# Patient Record
Sex: Female | Born: 1985 | Race: White | Hispanic: No | Marital: Married | Smoking: Former smoker
Health system: Southern US, Community
[De-identification: ages and names within clinical notes are randomized; demographics above are authoritative.]

## PROBLEM LIST (undated history)

## (undated) ENCOUNTER — Inpatient Hospital Stay (HOSPITAL_COMMUNITY): Payer: Self-pay

## (undated) DIAGNOSIS — Z8619 Personal history of other infectious and parasitic diseases: Secondary | ICD-10-CM

## (undated) DIAGNOSIS — F419 Anxiety disorder, unspecified: Secondary | ICD-10-CM

## (undated) DIAGNOSIS — R51 Headache: Secondary | ICD-10-CM

## (undated) DIAGNOSIS — R519 Headache, unspecified: Secondary | ICD-10-CM

## (undated) DIAGNOSIS — Z202 Contact with and (suspected) exposure to infections with a predominantly sexual mode of transmission: Secondary | ICD-10-CM

## (undated) DIAGNOSIS — J45909 Unspecified asthma, uncomplicated: Secondary | ICD-10-CM

## (undated) HISTORY — DX: Personal history of other infectious and parasitic diseases: Z86.19

## (undated) HISTORY — DX: Contact with and (suspected) exposure to infections with a predominantly sexual mode of transmission: Z20.2

## (undated) HISTORY — PX: CHOLECYSTECTOMY: SHX55

---

## 2012-04-11 ENCOUNTER — Encounter: Payer: Self-pay | Admitting: Obstetrics and Gynecology

## 2012-06-26 LAB — OB RESULTS CONSOLE GC/CHLAMYDIA: Chlamydia: NEGATIVE

## 2012-06-26 LAB — OB RESULTS CONSOLE HEPATITIS B SURFACE ANTIGEN: Hepatitis B Surface Ag: NEGATIVE

## 2012-06-26 LAB — OB RESULTS CONSOLE ANTIBODY SCREEN: Antibody Screen: NEGATIVE

## 2012-08-30 ENCOUNTER — Encounter (HOSPITAL_COMMUNITY): Payer: Self-pay | Admitting: *Deleted

## 2012-08-30 ENCOUNTER — Emergency Department (HOSPITAL_COMMUNITY)
Admission: EM | Admit: 2012-08-30 | Discharge: 2012-08-30 | Disposition: A | Discharge status: Home / Self Care | Payer: BC Managed Care – PPO | Attending: Emergency Medicine | Admitting: Emergency Medicine

## 2012-08-30 ENCOUNTER — Emergency Department (HOSPITAL_COMMUNITY): Payer: BC Managed Care – PPO

## 2012-08-30 DIAGNOSIS — O9989 Other specified diseases and conditions complicating pregnancy, childbirth and the puerperium: Secondary | ICD-10-CM | POA: Insufficient documentation

## 2012-08-30 DIAGNOSIS — R0602 Shortness of breath: Secondary | ICD-10-CM | POA: Insufficient documentation

## 2012-08-30 DIAGNOSIS — J45901 Unspecified asthma with (acute) exacerbation: Secondary | ICD-10-CM | POA: Insufficient documentation

## 2012-08-30 DIAGNOSIS — O99330 Smoking (tobacco) complicating pregnancy, unspecified trimester: Secondary | ICD-10-CM | POA: Insufficient documentation

## 2012-08-30 HISTORY — DX: Unspecified asthma, uncomplicated: J45.909

## 2012-08-30 MED ORDER — ALBUTEROL SULFATE HFA 108 (90 BASE) MCG/ACT IN AERS: 1.0000 | INHALATION_SPRAY | RESPIRATORY_TRACT | Status: DC

## 2012-08-30 MED ORDER — ALBUTEROL SULFATE (5 MG/ML) 0.5% IN NEBU: 5.0000 mg | INHALATION_SOLUTION | Freq: Once | RESPIRATORY_TRACT | Status: DC

## 2012-08-30 MED ORDER — IPRATROPIUM BROMIDE 0.02 % IN SOLN
0.5000 mg | Freq: Once | RESPIRATORY_TRACT | Status: AC
  Administered 2012-08-30: 0.5 mg via RESPIRATORY_TRACT
  Filled 2012-08-30: qty 2.5

## 2012-08-30 MED ORDER — ALBUTEROL (5 MG/ML) CONTINUOUS INHALATION SOLN
10.0000 mg/h | INHALATION_SOLUTION | RESPIRATORY_TRACT | Status: DC
  Administered 2012-08-30: 10 mg/h via RESPIRATORY_TRACT
  Filled 2012-08-30: qty 20

## 2012-08-30 NOTE — ED Notes (Signed)
Pt reports sob r/t asthma since this am. Was seen at her PCP and given 2 albuterol treatments, reports no relief. Hx asthma, no recent attacks. Pt is [redacted] wks pregnant. No OB complaints.

## 2012-08-30 NOTE — ED Provider Notes (Signed)
History     CSN: [id number]  Arrival date & time 08/30/12  1148   First MD Initiated Contact with Patient 08/30/12 1154      Chief Complaint  Patient presents with  . Asthma  . Shortness of Breath    (Consider location/radiation/quality/duration/timing/severity/associated sxs/prior treatment) HPI The patient presents with dyspnea.  Symptoms began approximately 4 hours prior to ER arrival.  The patient has a history of asthma, though no recent exacerbations, no recent steroid use.,  She is [redacted] weeks pregnant.  She states this episode began after precipitant, and since onset has not been relieved in spite of using 2 albuterol treatments at her physician's office.  She denies chest pain, syncope, abdominal pain, vomiting, diarrhea. She states that this pregnancy is proceeding without consultation, she has seen her obstetrician several times.  Past Medical History  Diagnosis Date  . Asthma     Past Surgical History  Procedure Laterality Date  . Cholecystectomy      No family history on file.  History  Substance Use Topics  . Smoking status: Current Every Day Smoker  . Smokeless tobacco: Not on file  . Alcohol Use: No    OB History   Grav Para Term Preterm Abortions TAB SAB Ect Mult Living   1               Review of Systems  Constitutional:       Per HPI, otherwise negative  HENT:       Per HPI, otherwise negative  Respiratory:       Per HPI, otherwise negative  Cardiovascular:       Per HPI, otherwise negative  Gastrointestinal: Negative for nausea, vomiting, abdominal pain and diarrhea.  Endocrine:       Negative aside from HPI  Genitourinary:       Neg aside from HPI   Musculoskeletal:       Per HPI, otherwise negative  Skin: Negative.   Neurological: Negative for syncope.    Allergies  Review of patient's allergies indicates no known allergies.  Home Medications  No current outpatient prescriptions on file.  BP 128/60  Pulse 93  Temp(Src) 0 F  (-17.8 C)  Resp 35  SpO2 99%  Physical Exam  Nursing note and vitals reviewed. Constitutional: She is oriented to person, place, and time. She appears well-developed and well-nourished. No distress.  HENT:  Head: Normocephalic and atraumatic.  Eyes: Conjunctivae and EOM are normal.  Cardiovascular: Normal rate and regular rhythm.   Pulmonary/Chest: Accessory muscle usage present. No stridor. Tachypnea noted. No respiratory distress. She has decreased breath sounds. She has wheezes.  Abdominal: She exhibits no distension.  Soft, gravid, abdomen  Musculoskeletal: She exhibits no edema.  Neurological: She is alert and oriented to person, place, and time. No cranial nerve deficit.  Skin: Skin is warm and dry.  Psychiatric: She has a normal mood and affect.    ED Course  [place] bedside Date/Time: 08/30/2012 3:20 PM Performed by: Gerhard Munch Authorized by: Gerhard Munch Risks and benefits: risks, benefits and alternatives were discussed Consent given by: patient Patient understanding: patient states understanding of the procedure being performed Patient consent: the patient's understanding of the procedure matches consent given Procedure consent: procedure consent matches procedure scheduled Relevant documents: relevant documents present and verified Test results: test results available and properly labeled Site marked: the operative site was marked Imaging studies: imaging studies available Required items: required blood products, implants, devices, and special equipment available Patient  identity confirmed: verbally with patient Time out: Immediately prior to procedure a "time out" was called to verify the correct patient, procedure, equipment, support staff and site/side marked as required. Preparation: Patient was prepped and draped in the usual sterile fashion. Local anesthesia used: no Patient sedated: no Patient tolerance: Patient tolerated the procedure well with no  immediate complications. Comments: FHT 120's - w significant motion.   (including critical care time)  Labs Reviewed - No data to display Dg Chest 2 View (if Patient Has Fever And/or Copd)  08/30/2012  *RADIOLOGY REPORT*  Clinical Data: Asthma, shortness of breath  CHEST - 2 VIEW  Comparison: 11/16/11  Findings: Cardiomediastinal silhouette is stable.  No acute infiltrate or pleural effusion.  No pulmonary edema.  Bony thorax is stable.  IMPRESSION: No active disease.   Original Report Authenticated By: Natasha Mead, M.D.      No diagnosis found.  Update: Following initial meds, the patient was improved, though she continued to have bilateral wheezing.  After discussion on the present cons of all medication in pregnancy, she will have a continuous nebulizer treatment, be reassessed.  3:15 PM Patient substantially better.  No wheezing.  MDM  This [redacted] week pregnant female presents with ongoing dyspnea.  Notably, patient's pregnancy has been going well prior to this point.  On initial exam the patient is tachypneic, uncomfortable appearing.  This improved substantially following multiple albuterol treatments.  The patient's wheezing stopped entirely, and there is no refractory wheezing after a period of observation.  Fetal heart tones were good, good fetal motion, and the patient has an access to obstetric followup, including an appointment tomorrow.       Gerhard Munch, MD 08/30/12 [phone]

## 2012-08-30 NOTE — ED Notes (Signed)
Patient transported to X-ray 

## 2012-11-12 ENCOUNTER — Encounter (HOSPITAL_COMMUNITY): Payer: Self-pay | Admitting: *Deleted

## 2012-11-12 ENCOUNTER — Inpatient Hospital Stay (HOSPITAL_COMMUNITY)
Admission: AD | Admit: 2012-11-12 | Discharge: 2012-11-12 | Disposition: A | Discharge status: Home / Self Care | Payer: BC Managed Care – PPO | Source: Ambulatory Visit | Attending: Obstetrics & Gynecology | Admitting: Obstetrics & Gynecology

## 2012-11-12 ENCOUNTER — Inpatient Hospital Stay (HOSPITAL_COMMUNITY): Payer: BC Managed Care – PPO

## 2012-11-12 DIAGNOSIS — O99891 Other specified diseases and conditions complicating pregnancy: Secondary | ICD-10-CM | POA: Insufficient documentation

## 2012-11-12 HISTORY — DX: Anxiety disorder, unspecified: F41.9

## 2012-11-12 LAB — OB RESULTS CONSOLE ABO/RH: RH Type: POSITIVE

## 2012-11-12 NOTE — MAU Provider Note (Signed)
Ms. Shanicqua Coldren is a 27 y.o. G1P0 at [redacted]w[redacted]d who presents to MAU today with complaint of LOF.   BP 121/76  Pulse 92  Temp(Src) 98.5 F (36.9 C) (Oral)  Resp 18  Ht 5\' 6"  (1.676 m)  Wt 226 lb 3.2 oz (102.604 kg)  BMI 36.53 kg/m2 GENERAL: Well-developed, well-nourished female in no acute distress.  HEENT: Normocephalic, atraumatic.   LUNGS: Effort normal HEART: Regular rate  PELVIC: Normal external genitalia.  SKIN: Warm, dry and without erythema PSYCH: Normal mood and affect  Neg - pooling Neg - ferning  Report to RN. She will contact MD for further instruction  Freddi Starr, PA-C 11/12/2012 9:05 PM

## 2012-11-12 NOTE — MAU Note (Signed)
Pt G1 at 38.3wks leaking clear fluid since Saturday.  Seen in the office today, Dr. Arlyce Dice checked fluid.  Pt was told to come to MAU if continued leaking fluid.

## 2012-11-26 ENCOUNTER — Telehealth (HOSPITAL_COMMUNITY): Payer: Self-pay | Admitting: *Deleted

## 2012-11-26 ENCOUNTER — Encounter (HOSPITAL_COMMUNITY): Payer: Self-pay | Admitting: *Deleted

## 2012-11-26 NOTE — Telephone Encounter (Signed)
Preadmission screen  

## 2012-11-27 ENCOUNTER — Telehealth (HOSPITAL_COMMUNITY): Payer: Self-pay | Admitting: *Deleted

## 2012-11-27 ENCOUNTER — Encounter (HOSPITAL_COMMUNITY): Payer: Self-pay | Admitting: *Deleted

## 2012-11-27 LAB — OB RESULTS CONSOLE GBS: GBS: NEGATIVE

## 2012-11-27 NOTE — Telephone Encounter (Signed)
Preadmission screen  

## 2012-11-29 ENCOUNTER — Inpatient Hospital Stay (HOSPITAL_COMMUNITY)
Admission: RE | Admit: 2012-11-29 | Discharge: 2012-12-03 | DRG: 371 | Disposition: A | Discharge status: Home / Self Care | Payer: BC Managed Care – PPO | Source: Ambulatory Visit | Attending: Obstetrics and Gynecology | Admitting: Obstetrics and Gynecology

## 2012-11-29 ENCOUNTER — Encounter (HOSPITAL_COMMUNITY): Payer: Self-pay

## 2012-11-29 VITALS — BP 96/63 | HR 83 | Temp 97.8°F | Resp 20 | Ht 66.0 in | Wt 226.0 lb

## 2012-11-29 DIAGNOSIS — O480 Post-term pregnancy: Principal | ICD-10-CM | POA: Diagnosis present

## 2012-11-29 DIAGNOSIS — O34219 Maternal care for unspecified type scar from previous cesarean delivery: Secondary | ICD-10-CM

## 2012-11-29 LAB — CBC
MCH: 28.2 pg (ref 26.0–34.0)
MCHC: 33.3 g/dL (ref 30.0–36.0)
Platelets: 225 10*3/uL (ref 150–400)
RDW: 14.5 % (ref 11.5–15.5)

## 2012-11-29 LAB — TYPE AND SCREEN: ABO/RH(D): A POS

## 2012-11-29 MED ORDER — BUTORPHANOL TARTRATE 1 MG/ML IJ SOLN: 1.0000 mg | INTRAMUSCULAR | Status: DC | PRN

## 2012-11-29 MED ORDER — CITRIC ACID-SODIUM CITRATE 334-500 MG/5ML PO SOLN
30.0000 mL | ORAL | Status: DC | PRN
  Filled 2012-11-29: qty 15

## 2012-11-29 MED ORDER — LACTATED RINGERS IV SOLN
500.0000 mL | INTRAVENOUS | Status: DC | PRN
  Administered 2012-11-30: 300 mL via INTRAVENOUS
  Administered 2012-11-30: 500 mL via INTRAVENOUS
  Administered 2012-11-30: 300 mL via INTRAVENOUS

## 2012-11-29 MED ORDER — IBUPROFEN 600 MG PO TABS: 600.0000 mg | ORAL_TABLET | Freq: Four times a day (QID) | ORAL | Status: DC | PRN

## 2012-11-29 MED ORDER — OXYCODONE-ACETAMINOPHEN 5-325 MG PO TABS: 1.0000 | ORAL_TABLET | ORAL | Status: DC | PRN

## 2012-11-29 MED ORDER — MISOPROSTOL 25 MCG QUARTER TABLET
25.0000 ug | ORAL_TABLET | ORAL | Status: DC | PRN
  Administered 2012-11-29 – 2012-11-30 (×3): 25 ug via VAGINAL
  Filled 2012-11-29 (×3): qty 0.25

## 2012-11-29 MED ORDER — FLEET ENEMA 7-19 GM/118ML RE ENEM: 1.0000 | ENEMA | RECTAL | Status: DC | PRN

## 2012-11-29 MED ORDER — ACETAMINOPHEN 325 MG PO TABS: 650.0000 mg | ORAL_TABLET | ORAL | Status: DC | PRN

## 2012-11-29 MED ORDER — LACTATED RINGERS IV SOLN
INTRAVENOUS | Status: DC
  Administered 2012-11-30 – 2012-12-01 (×7): via INTRAVENOUS

## 2012-11-29 MED ORDER — ONDANSETRON HCL 4 MG/2ML IJ SOLN
4.0000 mg | Freq: Four times a day (QID) | INTRAMUSCULAR | Status: DC | PRN
  Administered 2012-11-30: 4 mg via INTRAVENOUS
  Filled 2012-11-29: qty 2

## 2012-11-29 MED ORDER — OXYTOCIN 40 UNITS IN LACTATED RINGERS INFUSION - SIMPLE MED: 62.5000 mL/h | INTRAVENOUS | Status: DC

## 2012-11-29 MED ORDER — LIDOCAINE HCL (PF) 1 % IJ SOLN: 30.0000 mL | INTRAMUSCULAR | Status: DC | PRN

## 2012-11-29 MED ORDER — OXYTOCIN BOLUS FROM INFUSION: 500.0000 mL | INTRAVENOUS | Status: DC

## 2012-11-29 MED ORDER — TERBUTALINE SULFATE 1 MG/ML IJ SOLN: 0.2500 mg | Freq: Once | INTRAMUSCULAR | Status: AC | PRN

## 2012-11-29 MED ORDER — ZOLPIDEM TARTRATE 5 MG PO TABS
5.0000 mg | ORAL_TABLET | Freq: Every evening | ORAL | Status: DC | PRN
  Administered 2012-11-29: 5 mg via ORAL
  Filled 2012-11-29: qty 1

## 2012-11-29 NOTE — H&P (Signed)
27 y.o. [redacted]w[redacted]d  G1P0 comes in for postdates induction.  Otherwise has good fetal movement and no bleeding.  Past Medical History  Diagnosis Date  . Asthma     as child  . Anxiety     no meds during pregnancy  . Hx of chlamydia infection   . Trichomonas contact, treated   . Hx of varicella     Past Surgical History  Procedure Laterality Date  . Cholecystectomy      OB History   Grav Para Term Preterm Abortions TAB SAB Ect Mult Living   1              # Outc Date GA Lbr Len/2nd Wgt Sex Del Anes PTL Lv   1 CUR               History   Social History  . Marital Status: Single    Spouse Name: N/A    Number of Children: N/A  . Years of Education: N/A   Occupational History  . Not on file.   Social History Main Topics  . Smoking status: Current Every Day Smoker -- 1.00 packs/day for 10 years    Types: Cigarettes  . Smokeless tobacco: Never Used  . Alcohol Use: No  . Drug Use: No  . Sexually Active: Yes   Other Topics Concern  . Not on file   Social History Narrative  . No narrative on file   Latex    Prenatal Transfer Tool  Maternal Diabetes: No Genetic Screening: Normal Maternal Ultrasounds/Referrals: Normal Fetal Ultrasounds or other Referrals:  None Maternal Substance Abuse:  Yes:  Type: Smoker Significant Maternal Medications:  None Significant Maternal Lab Results: None  Other WUJ:[id number].    Filed Vitals:   11/29/12 1946  BP: 117/73  Pulse: 91  Temp: 98.6 F (37 C)  Resp: 18     Lungs/Cor:  NAD Abdomen:  soft, gravid Ex:  no cords, erythema SVE:  0/70/-1  Per nurse. FHTs:  140s, good STV, NST R Toco:  q10-15   A/P   Postdates induction.   GBS neg.   Macio Kissoon A

## 2012-11-30 ENCOUNTER — Encounter (HOSPITAL_COMMUNITY): Payer: Self-pay | Admitting: Anesthesiology

## 2012-11-30 ENCOUNTER — Inpatient Hospital Stay (HOSPITAL_COMMUNITY): Payer: BC Managed Care – PPO | Admitting: Anesthesiology

## 2012-11-30 ENCOUNTER — Encounter (HOSPITAL_COMMUNITY): Payer: Self-pay

## 2012-11-30 LAB — RPR: RPR Ser Ql: NONREACTIVE

## 2012-11-30 MED ORDER — LACTATED RINGERS IV SOLN
500.0000 mL | Freq: Once | INTRAVENOUS | Status: AC
  Administered 2012-11-30: 500 mL via INTRAVENOUS

## 2012-11-30 MED ORDER — LIDOCAINE HCL (PF) 1 % IJ SOLN
INTRAMUSCULAR | Status: DC | PRN
  Administered 2012-11-30 – 2012-12-01 (×6): 4 mL

## 2012-11-30 MED ORDER — DIPHENHYDRAMINE HCL 50 MG/ML IJ SOLN: 12.5000 mg | INTRAMUSCULAR | Status: DC | PRN

## 2012-11-30 MED ORDER — FENTANYL 2.5 MCG/ML BUPIVACAINE 1/10 % EPIDURAL INFUSION (WH - ANES)
14.0000 mL/h | INTRAMUSCULAR | Status: DC | PRN
  Administered 2012-11-30 – 2012-12-01 (×4): 14 mL/h via EPIDURAL
  Filled 2012-11-30 (×5): qty 125

## 2012-11-30 MED ORDER — EPHEDRINE 5 MG/ML INJ: 10.0000 mg | INTRAVENOUS | Status: DC | PRN

## 2012-11-30 MED ORDER — TERBUTALINE SULFATE 1 MG/ML IJ SOLN: 0.2500 mg | Freq: Once | INTRAMUSCULAR | Status: AC | PRN

## 2012-11-30 MED ORDER — OXYTOCIN 40 UNITS IN LACTATED RINGERS INFUSION - SIMPLE MED
1.0000 m[IU]/min | INTRAVENOUS | Status: DC
  Administered 2012-11-30: 2 m[IU]/min via INTRAVENOUS
  Filled 2012-11-30: qty 1000

## 2012-11-30 MED ORDER — PHENYLEPHRINE 40 MCG/ML (10ML) SYRINGE FOR IV PUSH (FOR BLOOD PRESSURE SUPPORT): 80.0000 ug | PREFILLED_SYRINGE | INTRAVENOUS | Status: DC | PRN

## 2012-11-30 MED ORDER — LACTATED RINGERS IV SOLN
INTRAVENOUS | Status: DC
  Administered 2012-11-30: 18:00:00 via INTRAUTERINE

## 2012-11-30 MED ORDER — PHENYLEPHRINE 40 MCG/ML (10ML) SYRINGE FOR IV PUSH (FOR BLOOD PRESSURE SUPPORT)
80.0000 ug | PREFILLED_SYRINGE | INTRAVENOUS | Status: DC | PRN
  Filled 2012-11-30 (×2): qty 5

## 2012-11-30 MED ORDER — ZOLPIDEM TARTRATE 5 MG PO TABS: 5.0000 mg | ORAL_TABLET | Freq: Every evening | ORAL | Status: DC | PRN

## 2012-11-30 MED ORDER — EPHEDRINE 5 MG/ML INJ
10.0000 mg | INTRAVENOUS | Status: DC | PRN
  Filled 2012-11-30 (×2): qty 4

## 2012-11-30 NOTE — Anesthesia Procedure Notes (Addendum)
Epidural Patient location during procedure: OB Start time: 11/30/2012 11:45 AM  Staffing Performed by: anesthesiologist   Preanesthetic Checklist Completed: patient identified, site marked, surgical consent, pre-op evaluation, timeout performed, IV checked, risks and benefits discussed and monitors and equipment checked  Epidural Patient position: sitting Prep: site prepped and draped and DuraPrep Patient monitoring: continuous pulse ox and blood pressure Approach: midline Injection technique: LOR air  Needle:  Needle type: Tuohy  Needle gauge: 17 G Needle length: 9 cm and 9 Needle insertion depth: 7 cm Catheter type: closed end flexible Catheter size: 19 Gauge Catheter at skin depth: 12 cm Test dose: negative  Assessment Events: blood not aspirated, injection not painful, no injection resistance, negative IV test and no paresthesia  Additional Notes Discussed risk of headache, infection, bleeding, nerve injury and failed or incomplete block.  Patient voices understanding and wishes to proceed.  Epidural placed easily on first attempt.  No paresthesia.  Patient tolerated procedure well with no apparent complications.  Jasmine December, MDReason for block:procedure for pain  Epidural Patient location during procedure: OB Start time: 12/01/2012 4:53 AM  Staffing Anesthesiologist: Pinkie Manger A. Performed by: anesthesiologist   Preanesthetic Checklist Completed: patient identified, site marked, surgical consent, pre-op evaluation, timeout performed, IV checked, risks and benefits discussed and monitors and equipment checked  Epidural Patient position: sitting Prep: site prepped and draped and DuraPrep Patient monitoring: continuous pulse ox and blood pressure Approach: midline Injection technique: LOR air  Needle:  Needle type: Tuohy  Needle gauge: 17 G Needle length: 9 cm and 9 Needle insertion depth: 8 cm Catheter type: closed end flexible Catheter size: 19  Gauge Catheter at skin depth: 13 cm Test dose: negative and Other  Assessment Events: blood not aspirated, injection not painful, no injection resistance, negative IV test and no paresthesia  Additional Notes Patient identified. Risks and benefits discussed including failed block, incomplete  Pain control, post dural puncture headache, nerve damage, paralysis, blood pressure Changes, nausea, vomiting, reactions to medications-both toxic and allergic and post Partum back pain. All questions were answered. Patient expressed understanding and wished to proceed. Sterile technique was used throughout procedure. Epidural site was Dressed with sterile barrier dressing. No paresthesias, signs of intravascular injection Or signs of intrathecal spread were encountered.  Patient was more comfortable after the epidural was dosed. Please see RN's note for documentation of vital signs and FHR which are stable.

## 2012-11-30 NOTE — Anesthesia Preprocedure Evaluation (Signed)
Anesthesia Evaluation  Patient identified by MRN, date of birth, ID band Patient awake    Reviewed: Allergy & Precautions, H&P , NPO status , Patient's Chart, lab work & pertinent test results, reviewed documented beta blocker date and time   History of Anesthesia Complications Negative for: history of anesthetic complications  Airway Mallampati: I TM Distance: >3 FB Neck ROM: full    Dental  (+) Teeth Intact   Pulmonary asthma (only uses inhaler with illness (last was January with bronchitis)) , Current Smoker,  breath sounds clear to auscultation        Cardiovascular negative cardio ROS  Rhythm:regular Rate:Normal     Neuro/Psych PSYCHIATRIC DISORDERS (anxiety) negative neurological ROS     GI/Hepatic negative GI ROS, Neg liver ROS,   Endo/Other  BMI 36.6  Renal/GU negative Renal ROS  negative genitourinary   Musculoskeletal   Abdominal   Peds  Hematology negative hematology ROS (+)   Anesthesia Other Findings   Reproductive/Obstetrics (+) Pregnancy                           Anesthesia Physical Anesthesia Plan  ASA: III  Anesthesia Plan: Epidural   Post-op Pain Management:    Induction:   Airway Management Planned:   Additional Equipment:   Intra-op Plan:   Post-operative Plan:   Informed Consent: I have reviewed the patients History and Physical, chart, labs and discussed the procedure including the risks, benefits and alternatives for the proposed anesthesia with the patient or authorized representative who has indicated his/her understanding and acceptance.     Plan Discussed with:   Anesthesia Plan Comments:         Anesthesia Quick Evaluation

## 2012-11-30 NOTE — Progress Notes (Signed)
RN walked into patients room to find pt eating crackers with her broth, despite being informed of a clear liquid diet. MD and anesthesia both notified.

## 2012-11-30 NOTE — Progress Notes (Signed)
Patient ID: Brittany Gordon, female   DOB: 09-Oct-1985, 27 y.o.   MRN: [id number]   S: comfortable after epidural O:  Filed Vitals:   11/30/12 1218 11/30/12 1221 11/30/12 1223 11/30/12 1226  BP:  115/67  113/69  Pulse: 73 68 65 69  Temp:      TempSrc:      Resp:  18    Height:      Weight:      SpO2:   97%    AOX3, NAD FHT 145 reactive with accelerations, no decelerations CVX 1/70/-2 Toco Q2-5  Foley placed through the cervix without difficulty  A/P 1) Augment with pitocin starting at 1300 2) FWB reassuring

## 2012-12-01 ENCOUNTER — Encounter (HOSPITAL_COMMUNITY): Payer: Self-pay | Admitting: Anesthesiology

## 2012-12-01 ENCOUNTER — Encounter (HOSPITAL_COMMUNITY)
Admission: RE | Disposition: A | Discharge status: Home / Self Care | Payer: Self-pay | Source: Ambulatory Visit | Attending: Obstetrics and Gynecology

## 2012-12-01 ENCOUNTER — Inpatient Hospital Stay (HOSPITAL_COMMUNITY): Payer: BC Managed Care – PPO | Admitting: Anesthesiology

## 2012-12-01 ENCOUNTER — Encounter (HOSPITAL_COMMUNITY): Payer: Self-pay

## 2012-12-01 SURGERY — Surgical Case
Anesthesia: Epidural | Site: Abdomen | Wound class: Clean Contaminated

## 2012-12-01 MED ORDER — SODIUM BICARBONATE 8.4 % IV SOLN: INTRAVENOUS | Status: DC | PRN

## 2012-12-01 MED ORDER — MEPERIDINE HCL 25 MG/ML IJ SOLN: 6.2500 mg | INTRAMUSCULAR | Status: DC | PRN

## 2012-12-01 MED ORDER — DIPHENHYDRAMINE HCL 50 MG/ML IJ SOLN: 25.0000 mg | INTRAMUSCULAR | Status: DC | PRN

## 2012-12-01 MED ORDER — WITCH HAZEL-GLYCERIN EX PADS: 1.0000 "application " | MEDICATED_PAD | CUTANEOUS | Status: DC | PRN

## 2012-12-01 MED ORDER — ZOLPIDEM TARTRATE 5 MG PO TABS: 5.0000 mg | ORAL_TABLET | Freq: Every evening | ORAL | Status: DC | PRN

## 2012-12-01 MED ORDER — KETOROLAC TROMETHAMINE 60 MG/2ML IM SOLN
INTRAMUSCULAR | Status: AC
  Administered 2012-12-01: 60 mg via INTRAMUSCULAR
  Filled 2012-12-01: qty 2

## 2012-12-01 MED ORDER — CEFAZOLIN SODIUM-DEXTROSE 2-3 GM-% IV SOLR
2.0000 g | Freq: Once | INTRAVENOUS | Status: AC
  Administered 2012-12-01: 2 g via INTRAVENOUS
  Filled 2012-12-01: qty 50

## 2012-12-01 MED ORDER — KETOROLAC TROMETHAMINE 30 MG/ML IJ SOLN: 15.0000 mg | Freq: Once | INTRAMUSCULAR | Status: DC | PRN

## 2012-12-01 MED ORDER — IBUPROFEN 600 MG PO TABS
600.0000 mg | ORAL_TABLET | Freq: Four times a day (QID) | ORAL | Status: DC
  Administered 2012-12-01 – 2012-12-03 (×7): 600 mg via ORAL
  Filled 2012-12-01 (×7): qty 1

## 2012-12-01 MED ORDER — METOCLOPRAMIDE HCL 5 MG/ML IJ SOLN: 10.0000 mg | Freq: Three times a day (TID) | INTRAMUSCULAR | Status: DC | PRN

## 2012-12-01 MED ORDER — ONDANSETRON HCL 4 MG/2ML IJ SOLN: 4.0000 mg | Freq: Three times a day (TID) | INTRAMUSCULAR | Status: DC | PRN

## 2012-12-01 MED ORDER — SODIUM BICARBONATE 8.4 % IV SOLN
INTRAVENOUS | Status: DC | PRN
  Administered 2012-12-01: 5 mL via EPIDURAL

## 2012-12-01 MED ORDER — SENNOSIDES-DOCUSATE SODIUM 8.6-50 MG PO TABS
2.0000 | ORAL_TABLET | Freq: Every day | ORAL | Status: DC
  Administered 2012-12-01 – 2012-12-02 (×2): 2 via ORAL

## 2012-12-01 MED ORDER — OXYTOCIN 40 UNITS IN LACTATED RINGERS INFUSION - SIMPLE MED: 62.5000 mL/h | INTRAVENOUS | Status: AC

## 2012-12-01 MED ORDER — DIPHENHYDRAMINE HCL 25 MG PO CAPS: 25.0000 mg | ORAL_CAPSULE | ORAL | Status: DC | PRN

## 2012-12-01 MED ORDER — PHENYLEPHRINE HCL 10 MG/ML IJ SOLN
INTRAMUSCULAR | Status: DC | PRN
  Administered 2012-12-01 (×2): 40 ug via INTRAVENOUS

## 2012-12-01 MED ORDER — ONDANSETRON HCL 4 MG PO TABS: 4.0000 mg | ORAL_TABLET | ORAL | Status: DC | PRN

## 2012-12-01 MED ORDER — NALBUPHINE HCL 10 MG/ML IJ SOLN
5.0000 mg | INTRAMUSCULAR | Status: DC | PRN
  Administered 2012-12-01: 5 mg via INTRAVENOUS
  Filled 2012-12-01 (×2): qty 1

## 2012-12-01 MED ORDER — LANOLIN HYDROUS EX OINT: 1.0000 "application " | TOPICAL_OINTMENT | CUTANEOUS | Status: DC | PRN

## 2012-12-01 MED ORDER — PHENYLEPHRINE 40 MCG/ML (10ML) SYRINGE FOR IV PUSH (FOR BLOOD PRESSURE SUPPORT)
PREFILLED_SYRINGE | INTRAVENOUS | Status: AC
  Filled 2012-12-01: qty 5

## 2012-12-01 MED ORDER — LACTATED RINGERS IV SOLN
INTRAVENOUS | Status: DC
  Administered 2012-12-01: 19:00:00 via INTRAVENOUS

## 2012-12-01 MED ORDER — FENTANYL CITRATE 0.05 MG/ML IJ SOLN: 25.0000 ug | INTRAMUSCULAR | Status: DC | PRN

## 2012-12-01 MED ORDER — SIMETHICONE 80 MG PO CHEW
80.0000 mg | CHEWABLE_TABLET | Freq: Three times a day (TID) | ORAL | Status: DC
  Administered 2012-12-02 – 2012-12-03 (×5): 80 mg via ORAL

## 2012-12-01 MED ORDER — KETOROLAC TROMETHAMINE 60 MG/2ML IM SOLN: 60.0000 mg | Freq: Once | INTRAMUSCULAR | Status: AC | PRN

## 2012-12-01 MED ORDER — SODIUM CHLORIDE 0.9 % IR SOLN
Status: DC | PRN
  Administered 2012-12-01: 1000 mL

## 2012-12-01 MED ORDER — OXYCODONE-ACETAMINOPHEN 5-325 MG PO TABS
1.0000 | ORAL_TABLET | ORAL | Status: DC | PRN
  Administered 2012-12-02: 1 via ORAL
  Filled 2012-12-01: qty 1

## 2012-12-01 MED ORDER — MORPHINE SULFATE 0.5 MG/ML IJ SOLN
INTRAMUSCULAR | Status: AC
  Filled 2012-12-01: qty 10

## 2012-12-01 MED ORDER — NALOXONE HCL 0.4 MG/ML IJ SOLN: 0.4000 mg | INTRAMUSCULAR | Status: DC | PRN

## 2012-12-01 MED ORDER — SODIUM CHLORIDE 0.9 % IJ SOLN: 3.0000 mL | INTRAMUSCULAR | Status: DC | PRN

## 2012-12-01 MED ORDER — SIMETHICONE 80 MG PO CHEW
80.0000 mg | CHEWABLE_TABLET | ORAL | Status: DC | PRN
  Administered 2012-12-01 (×2): 80 mg via ORAL

## 2012-12-01 MED ORDER — PRENATAL MULTIVITAMIN CH
1.0000 | ORAL_TABLET | Freq: Every day | ORAL | Status: DC
  Administered 2012-12-02 – 2012-12-03 (×2): 1 via ORAL
  Filled 2012-12-01 (×2): qty 1

## 2012-12-01 MED ORDER — NALBUPHINE HCL 10 MG/ML IJ SOLN
5.0000 mg | INTRAMUSCULAR | Status: DC | PRN
  Filled 2012-12-01: qty 1

## 2012-12-01 MED ORDER — NALOXONE HCL 1 MG/ML IJ SOLN
1.0000 ug/kg/h | INTRAVENOUS | Status: DC | PRN
  Filled 2012-12-01: qty 2

## 2012-12-01 MED ORDER — KETOROLAC TROMETHAMINE 30 MG/ML IJ SOLN: 30.0000 mg | Freq: Four times a day (QID) | INTRAMUSCULAR | Status: AC | PRN

## 2012-12-01 MED ORDER — OXYTOCIN 10 UNIT/ML IJ SOLN
INTRAMUSCULAR | Status: AC
  Filled 2012-12-01: qty 4

## 2012-12-01 MED ORDER — OXYTOCIN 10 UNIT/ML IJ SOLN
40.0000 [IU] | INTRAVENOUS | Status: DC | PRN
  Administered 2012-12-01: 40 [IU] via INTRAVENOUS

## 2012-12-01 MED ORDER — CEFAZOLIN SODIUM-DEXTROSE 2-3 GM-% IV SOLR
INTRAVENOUS | Status: AC
  Filled 2012-12-01: qty 50

## 2012-12-01 MED ORDER — SODIUM BICARBONATE 8.4 % IV SOLN
INTRAVENOUS | Status: DC | PRN
  Administered 2012-12-01: 20 mL via EPIDURAL

## 2012-12-01 MED ORDER — CHLOROPROCAINE HCL 3 % IJ SOLN
INTRAMUSCULAR | Status: AC
  Filled 2012-12-01: qty 20

## 2012-12-01 MED ORDER — TETANUS-DIPHTH-ACELL PERTUSSIS 5-2.5-18.5 LF-MCG/0.5 IM SUSP
0.5000 mL | Freq: Once | INTRAMUSCULAR | Status: AC
  Administered 2012-12-01: 0.5 mL via INTRAMUSCULAR

## 2012-12-01 MED ORDER — DIBUCAINE 1 % RE OINT: 1.0000 "application " | TOPICAL_OINTMENT | RECTAL | Status: DC | PRN

## 2012-12-01 MED ORDER — SODIUM BICARBONATE 8.4 % IV SOLN
INTRAVENOUS | Status: AC
  Filled 2012-12-01: qty 50

## 2012-12-01 MED ORDER — ONDANSETRON HCL 4 MG/2ML IJ SOLN
INTRAMUSCULAR | Status: DC | PRN
  Administered 2012-12-01: 4 mg via INTRAVENOUS

## 2012-12-01 MED ORDER — METHYLERGONOVINE MALEATE 0.2 MG/ML IJ SOLN: 0.2000 mg | INTRAMUSCULAR | Status: DC | PRN

## 2012-12-01 MED ORDER — DIPHENHYDRAMINE HCL 25 MG PO CAPS: 25.0000 mg | ORAL_CAPSULE | Freq: Four times a day (QID) | ORAL | Status: DC | PRN

## 2012-12-01 MED ORDER — METHYLERGONOVINE MALEATE 0.2 MG PO TABS: 0.2000 mg | ORAL_TABLET | ORAL | Status: DC | PRN

## 2012-12-01 MED ORDER — ONDANSETRON HCL 4 MG/2ML IJ SOLN
INTRAMUSCULAR | Status: AC
  Filled 2012-12-01: qty 2

## 2012-12-01 MED ORDER — LIDOCAINE HCL (CARDIAC) 20 MG/ML IV SOLN
INTRAVENOUS | Status: AC
  Filled 2012-12-01: qty 5

## 2012-12-01 MED ORDER — DIPHENHYDRAMINE HCL 50 MG/ML IJ SOLN: 12.5000 mg | INTRAMUSCULAR | Status: DC | PRN

## 2012-12-01 MED ORDER — MENTHOL 3 MG MT LOZG: 1.0000 | LOZENGE | OROMUCOSAL | Status: DC | PRN

## 2012-12-01 MED ORDER — ONDANSETRON HCL 4 MG/2ML IJ SOLN
4.0000 mg | INTRAMUSCULAR | Status: DC | PRN
  Administered 2012-12-01: 4 mg via INTRAVENOUS
  Filled 2012-12-01: qty 2

## 2012-12-01 MED ORDER — SCOPOLAMINE 1 MG/3DAYS TD PT72
MEDICATED_PATCH | TRANSDERMAL | Status: AC
  Administered 2012-12-01: 1.5 mg via TRANSDERMAL
  Filled 2012-12-01: qty 1

## 2012-12-01 MED ORDER — SCOPOLAMINE 1 MG/3DAYS TD PT72: 1.0000 | MEDICATED_PATCH | Freq: Once | TRANSDERMAL | Status: DC

## 2012-12-01 MED ORDER — MORPHINE SULFATE (PF) 0.5 MG/ML IJ SOLN
INTRAMUSCULAR | Status: DC | PRN
  Administered 2012-12-01: 4 mg via EPIDURAL

## 2012-12-01 MED ORDER — PROMETHAZINE HCL 25 MG/ML IJ SOLN: 6.2500 mg | INTRAMUSCULAR | Status: DC | PRN

## 2012-12-01 SURGICAL SUPPLY — 36 items
CLAMP CORD UMBIL (MISCELLANEOUS) ×2 IMPLANT
CLOTH BEACON ORANGE TIMEOUT ST (SAFETY) ×2 IMPLANT
DERMABOND ADHESIVE PROPEN (GAUZE/BANDAGES/DRESSINGS) ×1
DERMABOND ADVANCED (GAUZE/BANDAGES/DRESSINGS) ×1
DERMABOND ADVANCED .7 DNX12 (GAUZE/BANDAGES/DRESSINGS) ×1 IMPLANT
DERMABOND ADVANCED .7 DNX6 (GAUZE/BANDAGES/DRESSINGS) ×1 IMPLANT
DRAPE LG THREE QUARTER DISP (DRAPES) ×2 IMPLANT
DRSG OPSITE POSTOP 4X10 (GAUZE/BANDAGES/DRESSINGS) ×2 IMPLANT
DURAPREP 26ML APPLICATOR (WOUND CARE) ×2 IMPLANT
ELECT REM PT RETURN 9FT ADLT (ELECTROSURGICAL) ×2
ELECTRODE REM PT RTRN 9FT ADLT (ELECTROSURGICAL) ×1 IMPLANT
EXTRACTOR VACUUM M CUP 4 TUBE (SUCTIONS) IMPLANT
GLOVE BIO SURGEON STRL SZ7 (GLOVE) IMPLANT
GLOVE SKINSENSE NS SZ6.5 (GLOVE) ×2
GLOVE SKINSENSE NS SZ7.0 (GLOVE) ×2
GLOVE SKINSENSE STRL SZ6.5 (GLOVE) ×2 IMPLANT
GLOVE SKINSENSE STRL SZ7.0 (GLOVE) ×2 IMPLANT
GOWN STRL REIN XL XLG (GOWN DISPOSABLE) ×4 IMPLANT
KIT ABG SYR 3ML LUER SLIP (SYRINGE) IMPLANT
NEEDLE HYPO 25X5/8 SAFETYGLIDE (NEEDLE) IMPLANT
NS IRRIG 1000ML POUR BTL (IV SOLUTION) ×2 IMPLANT
PACK C SECTION WH (CUSTOM PROCEDURE TRAY) ×2 IMPLANT
PAD OB MATERNITY 4.3X12.25 (PERSONAL CARE ITEMS) ×2 IMPLANT
RTRCTR C-SECT PINK 25CM LRG (MISCELLANEOUS) ×2 IMPLANT
STAPLER VISISTAT 35W (STAPLE) IMPLANT
SUT CHROMIC 1 CTX 36 (SUTURE) ×6 IMPLANT
SUT PDS AB 0 CTX 60 (SUTURE) ×4 IMPLANT
SUT PLAIN 2 0 XLH (SUTURE) ×2 IMPLANT
SUT VIC AB 2-0 CT1 27 (SUTURE) ×1
SUT VIC AB 2-0 CT1 TAPERPNT 27 (SUTURE) ×1 IMPLANT
SUT VIC AB 3-0 CT1 27 (SUTURE) ×1
SUT VIC AB 3-0 CT1 TAPERPNT 27 (SUTURE) ×1 IMPLANT
SUT VIC AB 4-0 KS 27 (SUTURE) ×2 IMPLANT
TOWEL OR 17X24 6PK STRL BLUE (TOWEL DISPOSABLE) ×6 IMPLANT
TRAY FOLEY CATH 14FR (SET/KITS/TRAYS/PACK) IMPLANT
WATER STERILE IRR 1000ML POUR (IV SOLUTION) ×2 IMPLANT

## 2012-12-01 NOTE — Progress Notes (Signed)
Patient ID: Brittany Gordon, female   DOB: [birth year], 27 y.o.   MRN: [id number]  S: More comfortable with her new epidural  O:  Filed Vitals:   12/01/12 0534 12/01/12 0601 12/01/12 0631 12/01/12 0701  BP: 112/65 108/72 107/72 113/74  Pulse: 81 81 83 83  Temp: 98.7 F (37.1 C)     TempSrc: Oral     Resp: 18 18 18    Height:      Weight:      SpO2:       cvx 9/90/-2 with edema of the anterior lip toco 2-4 FHT 145 with early variables  A/P 1) No cervical change in 4 hours despite adequate labor.  Will proceed with cesarean section.  R/B/A reviewed with the patient. Will proceed

## 2012-12-01 NOTE — Transfer of Care (Signed)
Immediate Anesthesia Transfer of Care Note  Patient: Brittany Gordon  Procedure(s) Performed: Procedure(s): primary cesarean section with delivery of baby girl at 34. Apgars 9/9. (N/A)  Patient Location: PACU  Anesthesia Type:Epidural  Level of Consciousness: awake and alert   Airway & Oxygen Therapy: Patient Spontanous Breathing  Post-op Assessment: Report given to PACU RN and Post -op Vital signs reviewed and stable  Post vital signs: Reviewed and stable  Complications: No apparent anesthesia complications

## 2012-12-01 NOTE — Op Note (Addendum)
Pt received 30 mL sodium citrate 12/01/12 @ 0740 prior to transport to OR for cesarean section; scanner broken, unable to locate medication on MAR - Elana Alm [facility]

## 2012-12-01 NOTE — OR Nursing (Signed)
Foley catheter in upon arrival to OR. Urine color-yellow. 

## 2012-12-01 NOTE — OR Nursing (Addendum)
Uterus massaged by S. Dovey Fatzinger [employer]ity fundraiser.  Two tubes of  Cord blood sent to lab.  15cc of blood evacuated from uterus during uterine massage.

## 2012-12-01 NOTE — OR Nursing (Signed)
Patient sent for at 0727.

## 2012-12-01 NOTE — Anesthesia Postprocedure Evaluation (Signed)
Anesthesia Post Note  Patient: Brittany Gordon  Procedure(s) Performed: Procedure(s) (LRB): primary cesarean section with delivery of baby girl at 100. Apgars 9/9. (N/A)  Anesthesia type: Epidural  Patient location: PACU  Post pain: Pain level controlled  Post assessment: Post-op Vital signs reviewed  Last Vitals:  Filed Vitals:   12/01/12 1000  BP:   Pulse: 94  Temp: 36.7 C  Resp: 16    Post vital signs: Reviewed  Level of consciousness: awake  Complications: No apparent anesthesia complications

## 2012-12-01 NOTE — Anesthesia Postprocedure Evaluation (Signed)
  Anesthesia Post-op Note  Patient: Melynda Cox  Procedure(s) Performed: Procedure(s): primary cesarean section with delivery of baby girl at 39. Apgars 9/9. (N/A)  Patient Location: Mother/Baby  Anesthesia Type:Epidural  Level of Consciousness: awake, alert  and patient cooperative  Airway and Oxygen Therapy: Patient Spontanous Breathing  Post-op Pain: mild  Post-op Assessment: Patient's Cardiovascular Status Stable, Respiratory Function Stable, Patent Airway, No signs of Nausea or vomiting, Pain level controlled and No headache  Post-op Vital Signs: Reviewed and stable  Complications: No apparent anesthesia complications

## 2012-12-01 NOTE — Anesthesia Preprocedure Evaluation (Addendum)
Anesthesia Evaluation Anesthesia Physical Anesthesia Plan  ASA: II  Anesthesia Plan: Epidural   Post-op Pain Management:    Induction:   Airway Management Planned:   Additional Equipment:   Intra-op Plan:   Post-operative Plan:   Informed Consent:   Plan Discussed with: CRNA, Anesthesiologist and Surgeon  Anesthesia Plan Comments:         Anesthesia Quick Evaluation                                   Anesthesia Evaluation  Patient identified by MRN, date of birth, ID band Patient awake    Reviewed: Allergy & Precautions, H&P , NPO status , Patient's Chart, lab work & pertinent test results, reviewed documented beta blocker date and time   History of Anesthesia Complications Negative for: history of anesthetic complications  Airway Mallampati: I TM Distance: >3 FB Neck ROM: full    Dental  (+) Teeth Intact   Pulmonary asthma (only uses inhaler with illness (last was January with bronchitis)) , Current Smoker,  breath sounds clear to auscultation        Cardiovascular negative cardio ROS  Rhythm:regular Rate:Normal     Neuro/Psych PSYCHIATRIC DISORDERS (anxiety) negative neurological ROS     GI/Hepatic negative GI ROS, Neg liver ROS,   Endo/Other  BMI 36.6  Renal/GU negative Renal ROS  negative genitourinary   Musculoskeletal   Abdominal   Peds  Hematology negative hematology ROS (+)   Anesthesia Other Findings   Reproductive/Obstetrics (+) Pregnancy                           Anesthesia Physical Anesthesia Plan  ASA: III  Anesthesia Plan: Epidural   Post-op Pain Management:    Induction:   Airway Management Planned:   Additional Equipment:   Intra-op Plan:   Post-operative Plan:   Informed Consent: I have reviewed the patients History and Physical, chart, labs and discussed the procedure including the risks, benefits and alternatives for the proposed anesthesia with the patient or  authorized representative who has indicated his/her understanding and acceptance.     Plan Discussed with:   Anesthesia Plan Comments:         Anesthesia Quick Evaluation

## 2012-12-01 NOTE — Op Note (Signed)
Pre-Operative Diagnosis: 1) 41+1 week intrauterine pregnancy 2) Failure to progress Postoperative Diagnosis: same Procedure: Primary low transverse cesarean section  Surgeon: Dr. Waynard Reeds Assistant: None Operative Findings: Vigorous female infant in the vertex presentation with apgars of 9 & 9.  Specimen: Placenta for disposal EBL: Total I/O In: 2000 [I.V.:2000] Out: 850 [Urine:50; Blood:800]   Procedure:Ms. Cox is an 27 year old gravida 1 para 0 at 110 weeks and 1 days estimated gestational age who presents for cesarean section. She was admitted for a post dates induction of labor and slowly progressed.  She made it to 9 cm dilated and failed to progress further and the decision was made to proceed with cesarean section.  Following the appropriate informed consent the patient was brought to the operating room where spinal anesthesia was administered and found to be adequate. She was placed in the dorsal supine position with a leftward tilt. She was prepped and draped in the normal sterile fashion. Scalpel was then used to make a Pfannenstiel skin incision which was carried down to the underlying layers of soft tissue to the fascia. The fascia was incised in the midline and the fascial incision was extended laterally with Mayo scissors. The superior aspect of the fascial incision was grasped with Coker clamps x2, tented up and the rectus muscles dissected off sharply with the electrocautery unit area and the same procedure was repeated on the inferior aspect of the fascial incision. The rectus muscles were separated in the midline. The abdominal peritoneum was identified, tented up, entered sharply, and the incision was extended superiorly and inferiorly with good visualization of the bladder. The Alexis retractor was then deployed. Scalpel was then used to make a low transverse incision on the uterus which was extended laterally with blunt dissection. The fetal vertex was identified, delivered easily  through the uterine incision followed by the body. The infant was bulb suctioned on the operative field cried vigorously, cord was clamped and cut and the infant was passed to the waiting neonatologist. Placenta was then delivered spontaneously, the uterus was cleared of all clot and debris. The uterine incision was repaired with #1 chromic in running locked fashion followed by a second imbricating layer. Ovaries and tubes were inspected and normal. The Alexis retractor was removed. The uterus was returned to the abdominal cavity the abdominal cavity was cleared of all clot and debris. The abdominal peritoneum was reapproximated with 2-0 Vicryl in a running fashion, the rectus muscles was reapproximated with #1 chromic in a running fashion. The fascia was closed with a looped PDS in a running fashion. The subcutaneous layer was reapproximated with 2-0 plain gut interrupted stitches. The skin was closed with 4-0 vicryl in a subcuticular fashion and Dermabond. All sponge lap and needle counts were correct x2. Patient tolerated the procedure well and recovered in stable condition following the procedure.

## 2012-12-02 LAB — CBC
Hemoglobin: 9.4 g/dL — ABNORMAL LOW (ref 12.0–15.0)
MCV: 85.1 fL (ref 78.0–100.0)
Platelets: 202 10*3/uL (ref 150–400)
RBC: 3.36 MIL/uL — ABNORMAL LOW (ref 3.87–5.11)
WBC: 19.8 10*3/uL — ABNORMAL HIGH (ref 4.0–10.5)

## 2012-12-03 ENCOUNTER — Encounter (HOSPITAL_COMMUNITY): Payer: Self-pay | Admitting: Obstetrics and Gynecology

## 2012-12-03 MED ORDER — OXYCODONE-ACETAMINOPHEN 5-325 MG PO TABS: 1.0000 | ORAL_TABLET | ORAL | Status: DC | PRN

## 2012-12-03 NOTE — Discharge Summary (Signed)
Obstetric Discharge Summary Reason for Admission: induction of labor Prenatal Procedures: none Intrapartum Procedures: cesarean: low cervical, transverse, FTP Postpartum Procedures: none Complications-Operative and Postpartum: none Hemoglobin  Date Value Range Status  12/02/2012 9.4* 12.0 - 15.0 g/dL Final     HCT  Date Value Range Status  12/02/2012 28.6* 36.0 - 46.0 % Final    Physical Exam:  General: alert and cooperative Lochia: appropriate Uterine Fundus: firm Incision: healing well, no significant drainage, no significant erythema DVT Evaluation: No evidence of DVT seen on physical exam.  Discharge Diagnoses: Term Pregnancy-delivered  Discharge Information: Date: 12/03/2012 Activity: pelvic rest Diet: routine Medications: PNV, Ibuprofen and Percocet Condition: stable Instructions: refer to practice specific booklet Discharge to: home Follow-up Information   Follow up with Almon Hercules., MD In 4 weeks. (Dr. Tenny Craw may also want to see you at 2 weeks)    Contact information:   [address] [place] [address] [phone]       Newborn Data: Live born female  Birth Weight: 6 lb 15.1 oz (3150 g) APGAR: 9, 9  Home with mother.  Brittany Gordon 12/03/2012, 9:43 AM

## 2012-12-05 NOTE — Anesthesia Postprocedure Evaluation (Signed)
  Anesthesia Post-op Note  Patient: Brittany Gordon  Procedure(s) Performed: Lumbar Epidural for L&D  Complications: No apparent anesthesia complications

## 2013-09-05 ENCOUNTER — Ambulatory Visit: Payer: Self-pay

## 2013-09-05 ENCOUNTER — Other Ambulatory Visit: Payer: Self-pay | Admitting: Occupational Medicine

## 2013-09-05 DIAGNOSIS — R52 Pain, unspecified: Secondary | ICD-10-CM

## 2014-04-28 ENCOUNTER — Encounter (HOSPITAL_COMMUNITY): Payer: Self-pay | Admitting: Obstetrics and Gynecology

## 2014-10-15 ENCOUNTER — Other Ambulatory Visit: Payer: Self-pay | Admitting: Internal Medicine

## 2014-10-15 DIAGNOSIS — R102 Pelvic and perineal pain: Secondary | ICD-10-CM

## 2014-12-11 IMAGING — CR DG WRIST COMPLETE 3+V*R*
4 series · 4 of 4 positions shown · non-contrast
Comparison: None.

CLINICAL DATA: Fell today.  Medial right wrist pain.

EXAM:
RIGHT WRIST - COMPLETE 3+ VIEW

[view not recorded (1 of 4)]
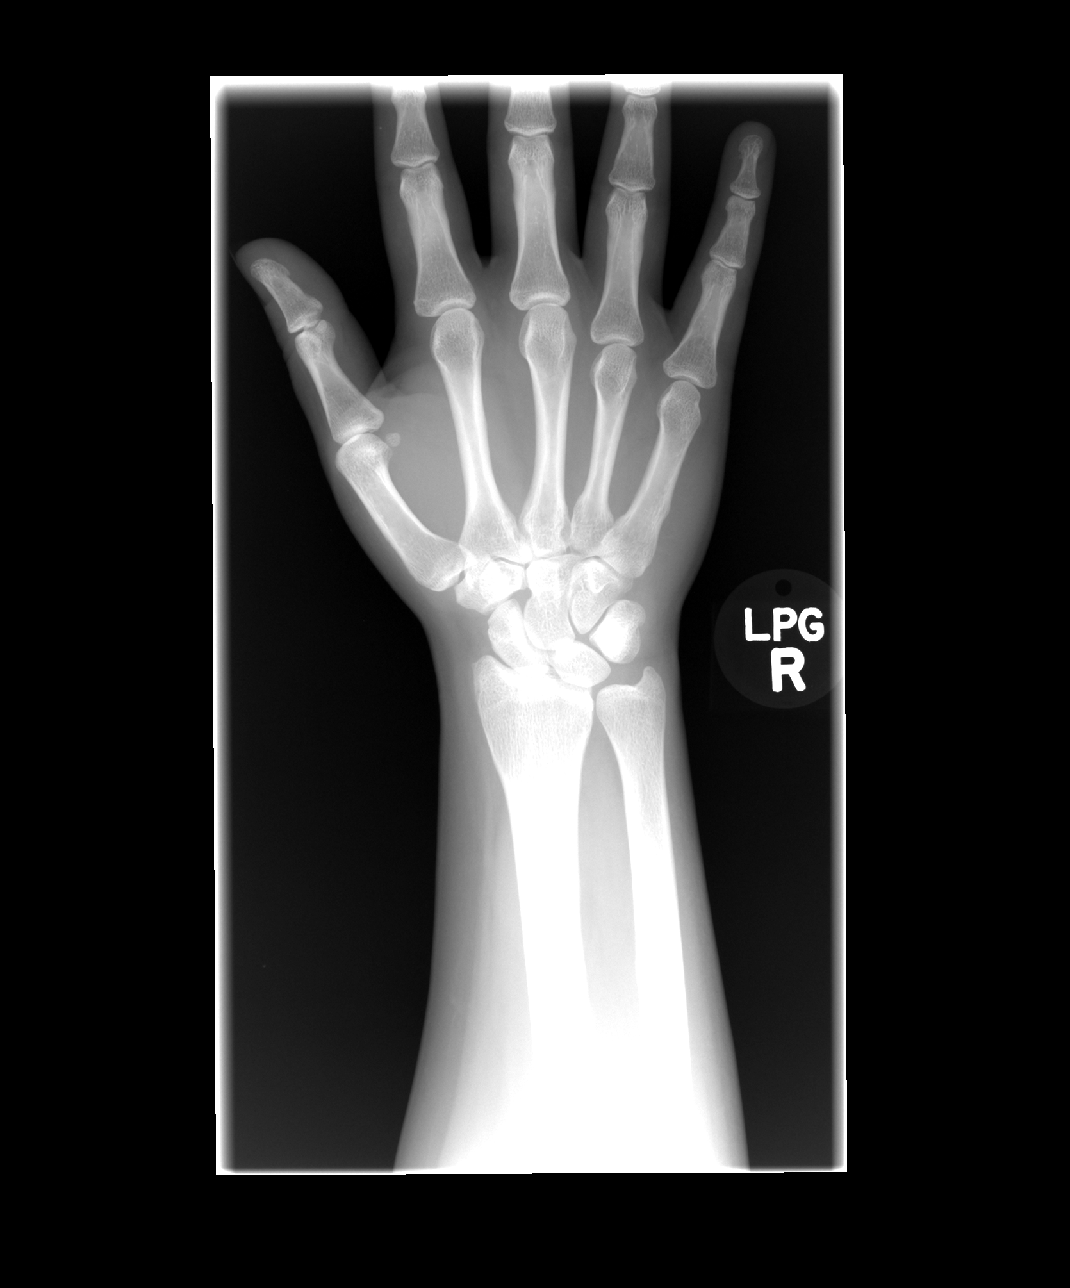

[view not recorded (2 of 4)]
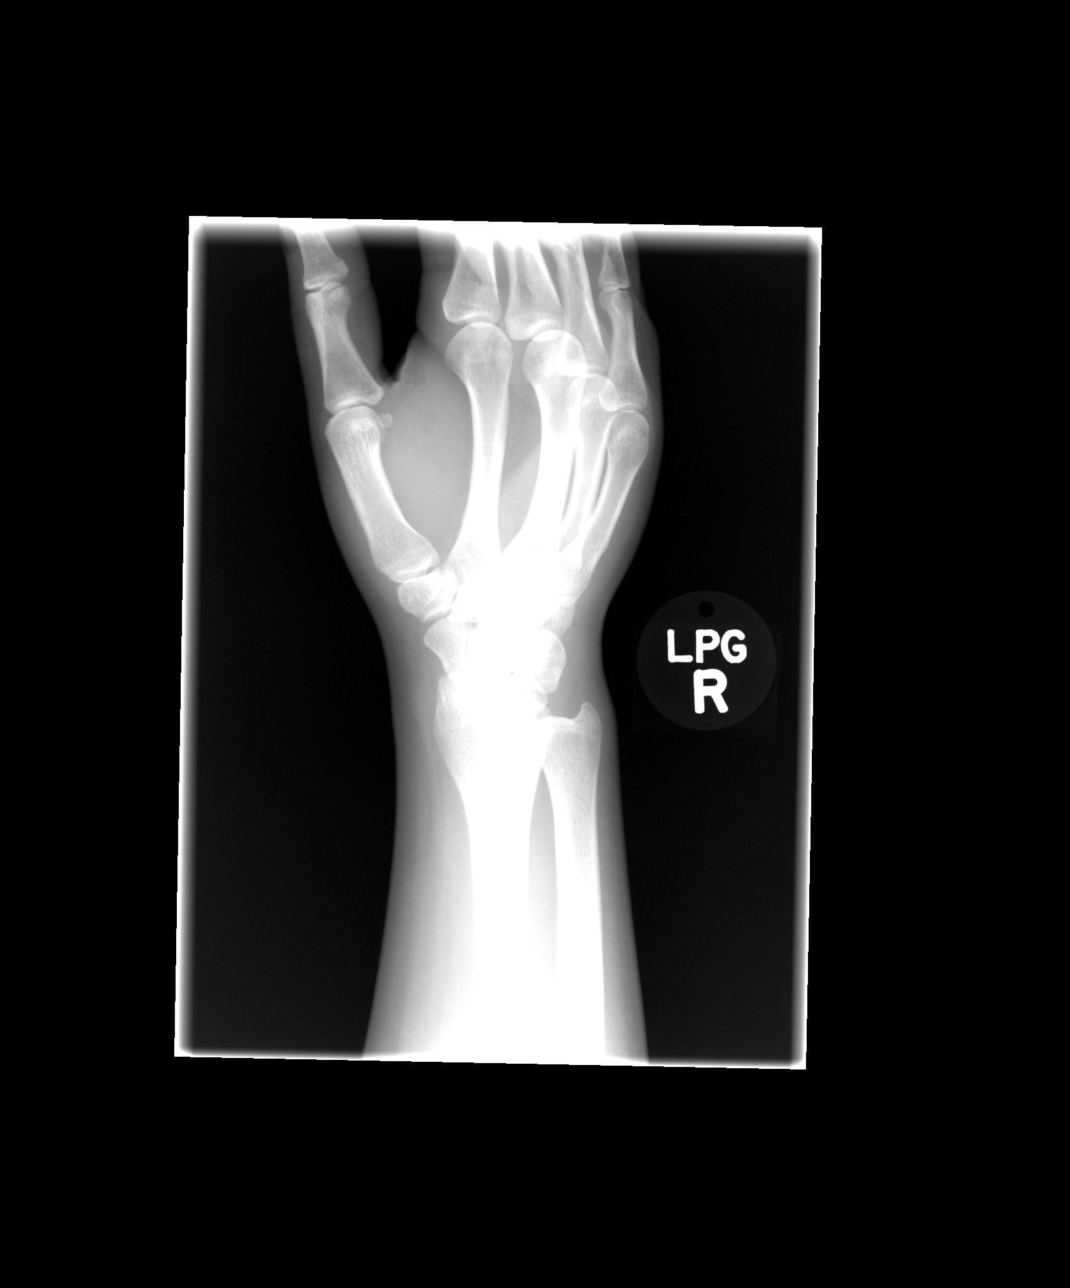

[view not recorded (3 of 4)]
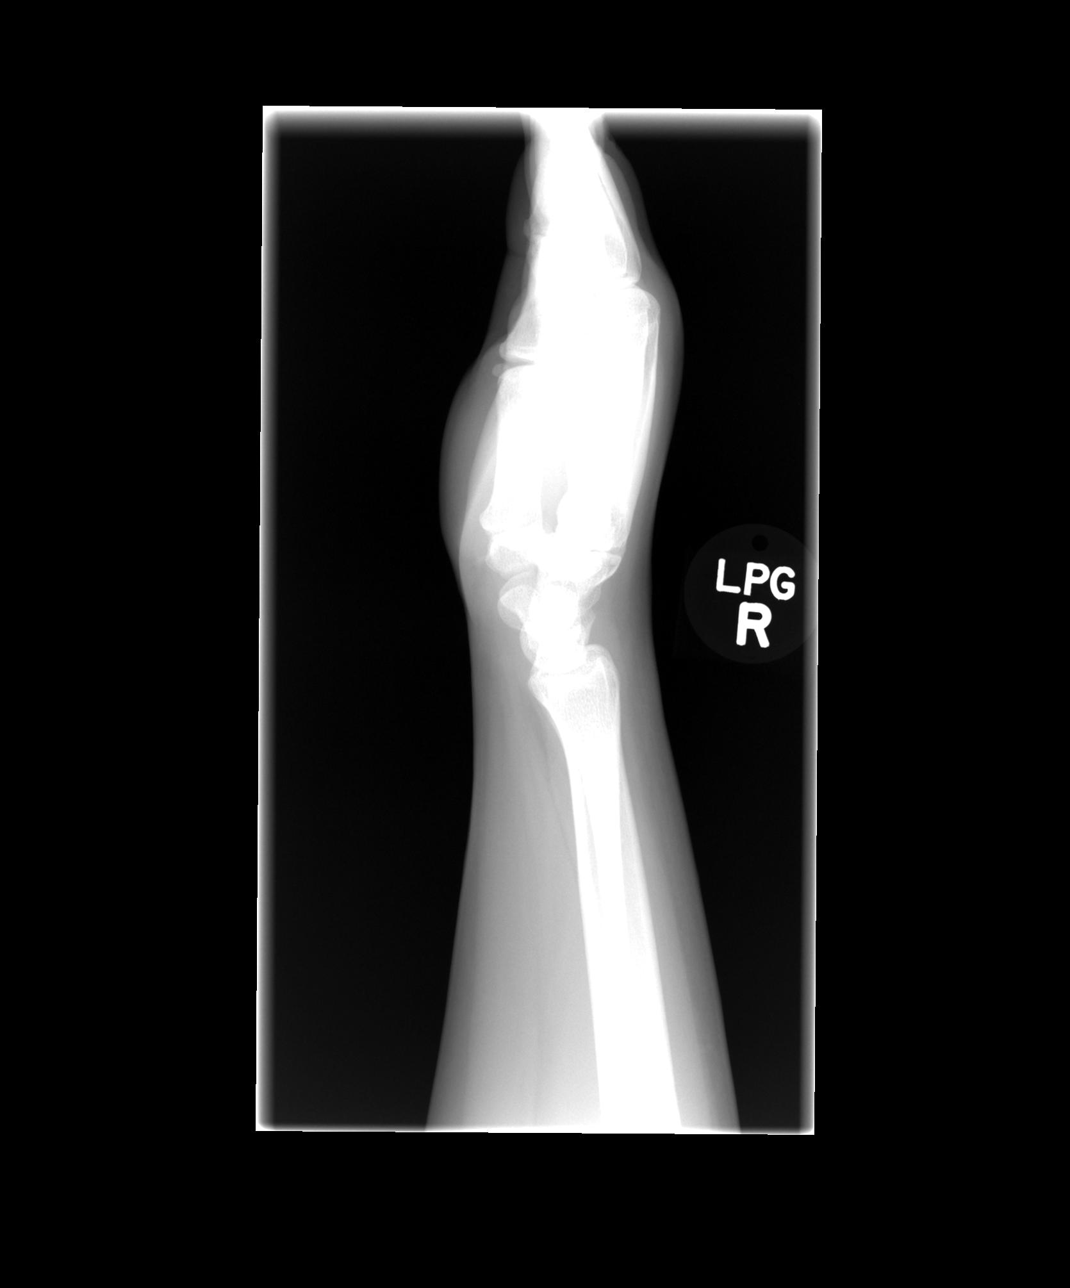

[view not recorded (4 of 4)]
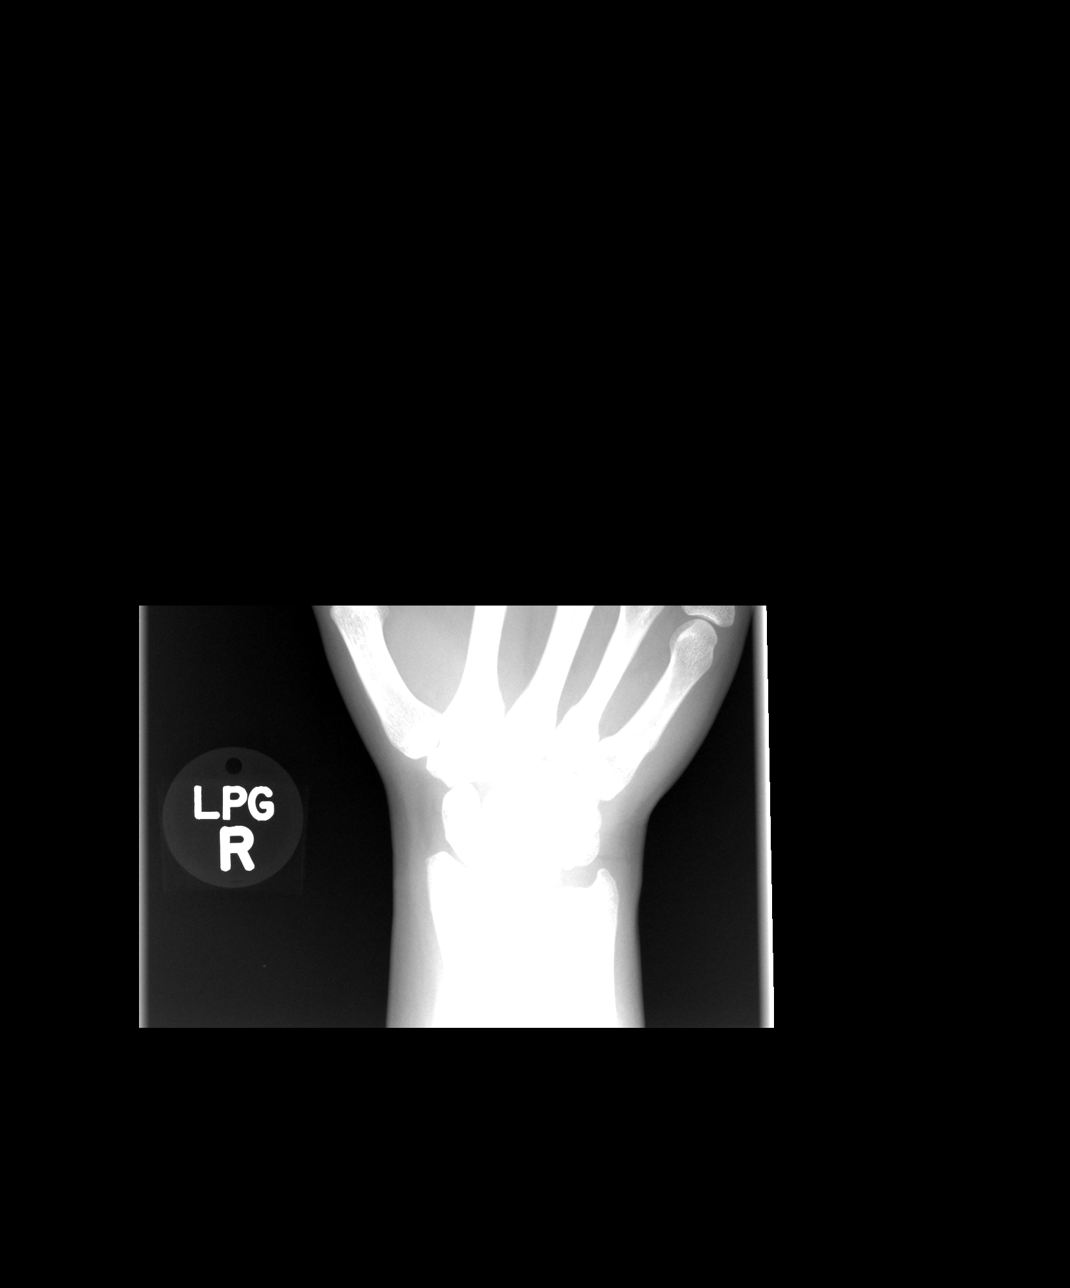

[4 of 4 positions shown; findings below may reference images not displayed]

FINDINGS: There is no evidence of fracture or dislocation. There is no
evidence of arthropathy or other focal bone abnormality. Soft
tissues are unremarkable.
IMPRESSION: Negative.

## 2016-11-18 DIAGNOSIS — Z30432 Encounter for removal of intrauterine contraceptive device: Secondary | ICD-10-CM | POA: Diagnosis not present

## 2017-01-20 DIAGNOSIS — R69 Illness, unspecified: Secondary | ICD-10-CM | POA: Diagnosis not present

## 2017-01-20 DIAGNOSIS — Z6832 Body mass index (BMI) 32.0-32.9, adult: Secondary | ICD-10-CM | POA: Diagnosis not present

## 2017-01-20 DIAGNOSIS — E669 Obesity, unspecified: Secondary | ICD-10-CM | POA: Diagnosis not present

## 2017-03-17 DIAGNOSIS — R69 Illness, unspecified: Secondary | ICD-10-CM | POA: Diagnosis not present

## 2017-03-17 DIAGNOSIS — K047 Periapical abscess without sinus: Secondary | ICD-10-CM | POA: Diagnosis not present

## 2017-05-11 DIAGNOSIS — N911 Secondary amenorrhea: Secondary | ICD-10-CM | POA: Diagnosis not present

## 2017-05-17 LAB — OB RESULTS CONSOLE RUBELLA ANTIBODY, IGM: RUBELLA: IMMUNE

## 2017-05-17 LAB — OB RESULTS CONSOLE GC/CHLAMYDIA
Chlamydia: NEGATIVE
GC PROBE AMP, GENITAL: NEGATIVE

## 2017-05-17 LAB — OB RESULTS CONSOLE ABO/RH: RH TYPE: POSITIVE

## 2017-05-17 LAB — OB RESULTS CONSOLE ANTIBODY SCREEN: Antibody Screen: NEGATIVE

## 2017-05-17 LAB — OB RESULTS CONSOLE HIV ANTIBODY (ROUTINE TESTING): HIV: NONREACTIVE

## 2017-05-17 LAB — OB RESULTS CONSOLE RPR: RPR: NONREACTIVE

## 2017-05-17 LAB — OB RESULTS CONSOLE HEPATITIS B SURFACE ANTIGEN: Hepatitis B Surface Ag: NEGATIVE

## 2017-06-15 DIAGNOSIS — Z3682 Encounter for antenatal screening for nuchal translucency: Secondary | ICD-10-CM | POA: Diagnosis not present

## 2017-06-15 DIAGNOSIS — Z3A12 12 weeks gestation of pregnancy: Secondary | ICD-10-CM | POA: Diagnosis not present

## 2017-07-28 DIAGNOSIS — Z3400 Encounter for supervision of normal first pregnancy, unspecified trimester: Secondary | ICD-10-CM | POA: Diagnosis not present

## 2017-07-28 DIAGNOSIS — Z363 Encounter for antenatal screening for malformations: Secondary | ICD-10-CM | POA: Diagnosis not present

## 2017-08-10 DIAGNOSIS — Z3480 Encounter for supervision of other normal pregnancy, unspecified trimester: Secondary | ICD-10-CM | POA: Diagnosis not present

## 2017-08-21 DIAGNOSIS — Z3483 Encounter for supervision of other normal pregnancy, third trimester: Secondary | ICD-10-CM | POA: Diagnosis not present

## 2017-08-21 DIAGNOSIS — Z3482 Encounter for supervision of other normal pregnancy, second trimester: Secondary | ICD-10-CM | POA: Diagnosis not present

## 2017-08-24 DIAGNOSIS — Z362 Encounter for other antenatal screening follow-up: Secondary | ICD-10-CM | POA: Diagnosis not present

## 2017-08-24 DIAGNOSIS — Z3400 Encounter for supervision of normal first pregnancy, unspecified trimester: Secondary | ICD-10-CM | POA: Diagnosis not present

## 2017-09-12 DIAGNOSIS — Z3A24 24 weeks gestation of pregnancy: Secondary | ICD-10-CM | POA: Diagnosis not present

## 2017-09-12 DIAGNOSIS — Z3400 Encounter for supervision of normal first pregnancy, unspecified trimester: Secondary | ICD-10-CM | POA: Diagnosis not present

## 2017-09-12 DIAGNOSIS — O26852 Spotting complicating pregnancy, second trimester: Secondary | ICD-10-CM | POA: Diagnosis not present

## 2017-09-12 DIAGNOSIS — N760 Acute vaginitis: Secondary | ICD-10-CM | POA: Diagnosis not present

## 2017-09-18 DIAGNOSIS — Z3480 Encounter for supervision of other normal pregnancy, unspecified trimester: Secondary | ICD-10-CM | POA: Diagnosis not present

## 2017-09-19 DIAGNOSIS — Z23 Encounter for immunization: Secondary | ICD-10-CM | POA: Diagnosis not present

## 2017-09-19 DIAGNOSIS — Z3492 Encounter for supervision of normal pregnancy, unspecified, second trimester: Secondary | ICD-10-CM | POA: Diagnosis not present

## 2017-10-19 ENCOUNTER — Encounter (HOSPITAL_COMMUNITY): Payer: Self-pay | Admitting: *Deleted

## 2017-10-19 ENCOUNTER — Inpatient Hospital Stay (HOSPITAL_COMMUNITY): Payer: Managed Care, Other (non HMO)

## 2017-10-19 ENCOUNTER — Inpatient Hospital Stay (HOSPITAL_COMMUNITY)
Admission: AD | Admit: 2017-10-19 | Discharge: 2017-10-21 | DRG: 832 | Disposition: A | Discharge status: Home / Self Care | Payer: Managed Care, Other (non HMO) | Source: Ambulatory Visit | Attending: Obstetrics and Gynecology | Admitting: Obstetrics and Gynecology

## 2017-10-19 DIAGNOSIS — J45901 Unspecified asthma with (acute) exacerbation: Secondary | ICD-10-CM | POA: Diagnosis present

## 2017-10-19 DIAGNOSIS — Z3A30 30 weeks gestation of pregnancy: Secondary | ICD-10-CM

## 2017-10-19 DIAGNOSIS — F17210 Nicotine dependence, cigarettes, uncomplicated: Secondary | ICD-10-CM | POA: Diagnosis present

## 2017-10-19 DIAGNOSIS — R0902 Hypoxemia: Secondary | ICD-10-CM

## 2017-10-19 DIAGNOSIS — J069 Acute upper respiratory infection, unspecified: Secondary | ICD-10-CM | POA: Diagnosis present

## 2017-10-19 DIAGNOSIS — J4541 Moderate persistent asthma with (acute) exacerbation: Secondary | ICD-10-CM | POA: Diagnosis not present

## 2017-10-19 DIAGNOSIS — R0602 Shortness of breath: Secondary | ICD-10-CM | POA: Diagnosis not present

## 2017-10-19 DIAGNOSIS — O99333 Smoking (tobacco) complicating pregnancy, third trimester: Secondary | ICD-10-CM | POA: Diagnosis present

## 2017-10-19 DIAGNOSIS — O99513 Diseases of the respiratory system complicating pregnancy, third trimester: Secondary | ICD-10-CM | POA: Diagnosis not present

## 2017-10-19 LAB — TYPE AND SCREEN
ABO/RH(D): A POS
Antibody Screen: NEGATIVE

## 2017-10-19 MED ORDER — LORATADINE 10 MG PO TABS
10.0000 mg | ORAL_TABLET | Freq: Every day | ORAL | Status: DC
  Administered 2017-10-19 – 2017-10-20 (×2): 10 mg via ORAL
  Filled 2017-10-19 (×3): qty 1

## 2017-10-19 MED ORDER — LACTATED RINGERS IV SOLN
INTRAVENOUS | Status: DC
  Administered 2017-10-19: 21:00:00 via INTRAVENOUS

## 2017-10-19 MED ORDER — MAGNESIUM SULFATE 40 G IN LACTATED RINGERS - SIMPLE: 2.0000 g/h | Freq: Once | INTRAVENOUS | Status: DC

## 2017-10-19 MED ORDER — CALCIUM CARBONATE ANTACID 500 MG PO CHEW: 2.0000 | CHEWABLE_TABLET | ORAL | Status: DC | PRN

## 2017-10-19 MED ORDER — IPRATROPIUM-ALBUTEROL 0.5-2.5 (3) MG/3ML IN SOLN
RESPIRATORY_TRACT | Status: AC
  Administered 2017-10-19: 3 mL via RESPIRATORY_TRACT
  Filled 2017-10-19: qty 3

## 2017-10-19 MED ORDER — BUDESONIDE 0.5 MG/2ML IN SUSP
0.5000 mg | Freq: Two times a day (BID) | RESPIRATORY_TRACT | Status: DC
  Administered 2017-10-20 (×2): 0.5 mg via RESPIRATORY_TRACT
  Filled 2017-10-19 (×4): qty 2

## 2017-10-19 MED ORDER — IPRATROPIUM-ALBUTEROL 0.5-2.5 (3) MG/3ML IN SOLN
3.0000 mL | RESPIRATORY_TRACT | Status: DC | PRN
  Administered 2017-10-19: 3 mL via RESPIRATORY_TRACT
  Filled 2017-10-19: qty 3

## 2017-10-19 MED ORDER — PREDNISONE 20 MG PO TABS: 20.0000 mg | ORAL_TABLET | Freq: Every day | ORAL | Status: DC

## 2017-10-19 MED ORDER — AZITHROMYCIN 250 MG PO TABS
250.0000 mg | ORAL_TABLET | Freq: Every day | ORAL | Status: DC
  Administered 2017-10-19 – 2017-10-20 (×2): 250 mg via ORAL
  Filled 2017-10-19 (×2): qty 1

## 2017-10-19 MED ORDER — METHYLPREDNISOLONE SODIUM SUCC 125 MG IJ SOLR
125.0000 mg | Freq: Three times a day (TID) | INTRAMUSCULAR | Status: DC
  Administered 2017-10-20: 125 mg via INTRAVENOUS
  Filled 2017-10-19 (×2): qty 2

## 2017-10-19 MED ORDER — DOCUSATE SODIUM 100 MG PO CAPS
100.0000 mg | ORAL_CAPSULE | Freq: Every day | ORAL | Status: DC
  Administered 2017-10-20: 100 mg via ORAL
  Filled 2017-10-19: qty 1

## 2017-10-19 MED ORDER — MAGNESIUM SULFATE 50 % IJ SOLN
2.0000 g | Freq: Once | INTRAVENOUS | Status: AC
  Administered 2017-10-19: 2 g via INTRAVENOUS
  Filled 2017-10-19: qty 4

## 2017-10-19 MED ORDER — ZOLPIDEM TARTRATE 5 MG PO TABS: 5.0000 mg | ORAL_TABLET | Freq: Every evening | ORAL | Status: DC | PRN

## 2017-10-19 MED ORDER — BUDESONIDE 180 MCG/ACT IN AEPB: 2.0000 | INHALATION_SPRAY | Freq: Two times a day (BID) | RESPIRATORY_TRACT | Status: DC

## 2017-10-19 MED ORDER — IPRATROPIUM-ALBUTEROL 0.5-2.5 (3) MG/3ML IN SOLN
3.0000 mL | Freq: Four times a day (QID) | RESPIRATORY_TRACT | Status: DC
  Administered 2017-10-19: 3 mL via RESPIRATORY_TRACT
  Filled 2017-10-19: qty 3

## 2017-10-19 MED ORDER — METHYLPREDNISOLONE SODIUM SUCC 125 MG IJ SOLR
125.0000 mg | Freq: Once | INTRAMUSCULAR | Status: AC
  Administered 2017-10-19: 125 mg via INTRAVENOUS
  Filled 2017-10-19: qty 2

## 2017-10-19 MED ORDER — ACETAMINOPHEN 325 MG PO TABS: 650.0000 mg | ORAL_TABLET | ORAL | Status: DC | PRN

## 2017-10-19 MED ORDER — PRENATAL MULTIVITAMIN CH
1.0000 | ORAL_TABLET | Freq: Every day | ORAL | Status: DC
  Filled 2017-10-19: qty 1

## 2017-10-19 MED ORDER — IPRATROPIUM-ALBUTEROL 0.5-2.5 (3) MG/3ML IN SOLN
3.0000 mL | RESPIRATORY_TRACT | Status: DC
  Administered 2017-10-20 (×4): 3 mL via RESPIRATORY_TRACT
  Filled 2017-10-19 (×10): qty 3

## 2017-10-19 MED ORDER — PRENATAL MULTIVITAMIN CH: 1.0000 | ORAL_TABLET | Freq: Every day | ORAL | Status: DC

## 2017-10-19 NOTE — H&P (Signed)
32 year old G 2 P 1001 at 30 weeks with asthma presents with asthma exacerbation. She has not seen a pulmonary MD in years.  She has been treated over past few weeks with inhalers and po steroids but today in office she was audibly wheezing and SOB.  She is now status post nebulizer and Solumedrol and is still feeling SOB   BP 127/69   Pulse 83   Resp 20   SpO2 98%   General alert and oriented  Lung Diffuse wheeze throughout    FHR Category 1  No results found for this or any previous visit (from the past 24 hour(s)).  Chest X Ray  No evidence of pneumonia  IMPRESSION: IUP at 30 weeks Asthma Exacerbation   PLAN: Admit Spoke to critical on call - Dr. Darrick Pennaeterding -  They will see her tomorrow Recommend tonight Nebulizer, Solumedrol, Magnesium 2 gram and blood gas

## 2017-10-19 NOTE — MAU Provider Note (Addendum)
Chief Complaint:  Shortness of Breath   None     HPI: Brittany Gordon is a 32 y.o. G2P1001 at [redacted]w[redacted]d who presents to maternity admissions sent from the office for evaluation of asthma exacerbation. She started with shortness of breath and wheezing 3-4 days ago, then developed a cough with thick yellow mucus and nasal congestion. She is on a Pulmicort steroid inhaler daily and has albuterol inhaler but these were not helping so she was started on a prednisone taper and a Z-pak but her symptoms are worsening.  She has not tried any other treatments. There are no other associated symptoms. She reports good fetal movement, denies abdominal cramping, LOF, vaginal bleeding, vaginal itching/burning, urinary symptoms, h/a, dizziness, n/v, or fever/chills.    HPI  Past Medical History: Past Medical History:  Diagnosis Date  . Anxiety    no meds during pregnancy  . Asthma    as child  . Hx of chlamydia infection   . Hx of varicella   . Trichomonas contact, treated     Past obstetric history: OB History  Gravida Para Term Preterm AB Living  2 1 1     1   SAB TAB Ectopic Multiple Live Births               # Outcome Date GA Lbr Len/2nd Weight Sex Delivery Anes PTL Lv  2 Current           1 Term 12/01/12 [redacted]w[redacted]d   F CS-LTranv EPI      Past Surgical History: Past Surgical History:  Procedure Laterality Date  . CESAREAN SECTION N/A 12/01/2012   Procedure: primary cesarean section with delivery of baby girl at 86. Apgars 9/9.;  Surgeon: Freddrick March. Tenny Craw, MD;  Location: [facility];  Service: Obstetrics;  Laterality: N/A;  . CHOLECYSTECTOMY      Family History: Family History  Problem Relation Age of Onset  . Hypertension Mother   . Heart attack Maternal Grandfather   . Diabetes Maternal Grandfather   . Diabetes Paternal Grandmother   . Hypertension Paternal Grandfather   . Diabetes Father     Social History: Social History   Tobacco Use  . Smoking status: Current Every Day Smoker   Packs/day: 1.00    Years: 10.00    Pack years: 10.00    Types: Cigarettes  . Smokeless tobacco: Never Used  Substance Use Topics  . Alcohol use: No  . Drug use: No    Allergies:  Allergies  Allergen Reactions  . Latex Rash    Meds:  Medications Prior to Admission  Medication Sig Dispense Refill Last Dose  . acetaminophen (TYLENOL) 500 MG tablet Take 1,000 mg by mouth every 6 (six) hours as needed for pain (For headache.).   Past Week at Unknown time  . albuterol (PROVENTIL HFA;VENTOLIN HFA) 108 (90 Base) MCG/ACT inhaler Inhale 2 puffs into the lungs every 4 (four) hours as needed for wheezing or shortness of breath.    10/19/2017 at Unknown time  . azithromycin (ZITHROMAX) 250 MG tablet Take 250 mg by mouth daily.   10/18/2017 at Unknown time  . budesonide (PULMICORT) 180 MCG/ACT inhaler Inhale 2 puffs into the lungs 2 (two) times daily.   10/19/2017 at Unknown time  . loratadine (CLARITIN) 10 MG tablet Take 10 mg by mouth daily.   10/18/2017 at Unknown time  . predniSONE (DELTASONE) 20 MG tablet Take 20 mg by mouth daily.   10/18/2017 at Unknown time  . Prenatal Vit-Fe Fumarate-FA (  PRENATAL MULTIVITAMIN) TABS tablet Take 1 tablet by mouth daily at 12 noon.   10/19/2017 at Unknown time    ROS:  Review of Systems  Constitutional: Negative for chills, fatigue and fever.  HENT: Positive for congestion.   Respiratory: Positive for cough, chest tightness and shortness of breath.   Cardiovascular: Negative for chest pain.  Gastrointestinal: Negative for abdominal pain.  Genitourinary: Negative for difficulty urinating, dysuria, flank pain, pelvic pain, vaginal bleeding, vaginal discharge and vaginal pain.  Neurological: Negative for dizziness and headaches.  Psychiatric/Behavioral: Negative.      I have reviewed patient's Past Medical Hx, Surgical Hx, Family Hx, Social Hx, medications and allergies.   Physical Exam   Patient Vitals for the past 24 hrs:  BP Pulse Resp SpO2  10/19/17  2038 - - - 98 %  10/19/17 1817 - - - 100 %  10/19/17 1719 127/69 83 20 100 %   Constitutional: Well-developed, well-nourished female in no acute distress.  HEART: normal rate, heart sounds, regular rhythm RESP: inspiratory and expiratory wheezing, crackles in all lobes GI: Abd soft, non-tender, gravid appropriate for gestational age.  MS: Extremities nontender, no edema, normal ROM Neurologic: Alert and oriented x 4.  GU: Neg CVAT.     FHT:  Baseline 135 , moderate variability, accelerations present, no decelerations Contractions: None on toco or to palpation   Labs: No results found for this or any previous visit (from the past 24 hour(s)).    Imaging:  Dg Chest 2 View  Result Date: 10/19/2017 CLINICAL DATA:  Shortness of breath and wheezing EXAM: CHEST - 2 VIEW COMPARISON:  08/30/2012 FINDINGS: The heart size and mediastinal contours are within normal limits. Both lungs are clear. The visualized skeletal structures are unremarkable. IMPRESSION: No active cardiopulmonary disease. Electronically Signed   By: Jasmine PangKim  Fujinaga M.D.   On: 10/19/2017 18:52    MAU Course/MDM: I have ordered labs and reviewed results.  NST reviewed and reactive Duoneb tx, CXR, solumedrol 125 mg IV Pt denies improvement after medications CXR wnl, no evidence of pneumonia Consult Grewal with presentation, exam findings and test results.  Admit to antepartum with nebulizer treatments Consult critical care  Assessment: 1. Moderate persistent asthma with exacerbation   2. Shortness of breath   3. Upper respiratory infection   4. Asthma exacerbation   5. [redacted] weeks gestation of pregnancy   6. Viral upper respiratory tract infection     Plan: Admit to antepartum Dr Vincente PoliGrewal to see pt  Sharen CounterLisa Leftwich-Kirby Certified Nurse-Midwife 10/19/2017 9:08 PM

## 2017-10-19 NOTE — Progress Notes (Signed)
Spoke with Dr. Vincente PoliGrewal concerning ABG order on patient.  Patient 100% on Room Air, RR 20, no increased work of breathing.  Patient has good air entry with expiratory wheezing.  Patient stated at the time of her last treatment that she was feeling much better than earlier.  ABG order is currently on hold.  Dr Vincente PoliGrewal wants ABG done only if patient begins to have increased work of breathing or requires more breathing treatments than Duoneb every 4 hours.  RT will monitor patient.

## 2017-10-19 NOTE — MAU Note (Signed)
Pt c/o sob and wheezing. Hx of asthma OB sent her here for eval. Pt on 2 Inhalers z-pac and prednisone. Not improving.

## 2017-10-20 ENCOUNTER — Other Ambulatory Visit: Payer: Self-pay

## 2017-10-20 DIAGNOSIS — R0902 Hypoxemia: Secondary | ICD-10-CM

## 2017-10-20 DIAGNOSIS — J45901 Unspecified asthma with (acute) exacerbation: Secondary | ICD-10-CM

## 2017-10-20 DIAGNOSIS — R0602 Shortness of breath: Secondary | ICD-10-CM

## 2017-10-20 LAB — BASIC METABOLIC PANEL
Anion gap: 10 (ref 5–15)
BUN: 9 mg/dL (ref 6–20)
CALCIUM: 8.6 mg/dL — AB (ref 8.9–10.3)
CO2: 19 mmol/L — AB (ref 22–32)
CREATININE: 0.58 mg/dL (ref 0.44–1.00)
Chloride: 108 mmol/L (ref 101–111)
GFR calc Af Amer: >60 mL/min (ref >60)
GFR calc non Af Amer: >60 mL/min (ref >60)
GLUCOSE: 195 mg/dL — AB (ref 65–99)
Potassium: 4.3 mmol/L (ref 3.5–5.1)
Sodium: 137 mmol/L (ref 135–145)

## 2017-10-20 LAB — CBC
HCT: 31.7 % — ABNORMAL LOW (ref 36.0–46.0)
Hemoglobin: 10.5 g/dL — ABNORMAL LOW (ref 12.0–15.0)
MCH: 29.1 pg (ref 26.0–34.0)
MCHC: 33.1 g/dL (ref 30.0–36.0)
MCV: 87.8 fL (ref 78.0–100.0)
PLATELETS: 247 10*3/uL (ref 150–400)
RBC: 3.61 MIL/uL — ABNORMAL LOW (ref 3.87–5.11)
RDW: 13.5 % (ref 11.5–15.5)
WBC: 25.4 10*3/uL — ABNORMAL HIGH (ref 4.0–10.5)

## 2017-10-20 LAB — MAGNESIUM: Magnesium: 1.9 mg/dL (ref 1.7–2.4)

## 2017-10-20 MED ORDER — METHYLPREDNISOLONE SODIUM SUCC 125 MG IJ SOLR
60.0000 mg | Freq: Two times a day (BID) | INTRAMUSCULAR | Status: DC
  Administered 2017-10-20: 60 mg via INTRAVENOUS
  Filled 2017-10-20 (×2): qty 0.96

## 2017-10-20 MED ORDER — METHYLPREDNISOLONE SODIUM SUCC 125 MG IJ SOLR
125.0000 mg | Freq: Once | INTRAMUSCULAR | Status: AC
  Administered 2017-10-20: 125 mg via INTRAVENOUS
  Filled 2017-10-20: qty 2

## 2017-10-20 NOTE — Progress Notes (Signed)
Pt feeling better today.  + FM.  No ctx, vb or lof.  No CP or SOB  AF, VSS Gen - NAD Abd - gravid, NT Ext - NT  A/P:  30 weeks w/ asthma exacerbation Appreciate pulmonary recs - will continue current regimen Labs pending

## 2017-10-20 NOTE — Plan of Care (Signed)
  Problem: Education: Goal: Knowledge of General Education information will improve 10/20/2017 0445 by Bobbye MortonAcheampong, Aprel Egelhoff, RN Outcome: Progressing 10/20/2017 0214 by Bobbye MortonAcheampong, Loisann Roach, RN Outcome: Progressing   Problem: Activity: Goal: Risk for activity intolerance will decrease 10/20/2017 0445 by Bobbye MortonAcheampong, Ashna Dorough, RN Outcome: Progressing 10/20/2017 0214 by Bobbye MortonAcheampong, Harjot Zavadil, RN Outcome: Progressing   Problem: Coping: Goal: Level of anxiety will decrease Outcome: Progressing

## 2017-10-20 NOTE — Progress Notes (Signed)
RT went to give patient her Pulmicort and Duoneb tx. Pt refused and said she feels fine. RT listened to BBS and they were clear and equal with good air movement. Pt. Was 100% on RA. RT encouraged pt to call if she felt as if she needed the tx at a later time. RT let pt know she would be back at 12 and 4 to check on her and offer her Duoneb tx.

## 2017-10-20 NOTE — Consult Note (Signed)
Name: Brittany Gordon MRN: [id number] DOB: 07/11/1985    ADMISSION DATE:  10/19/2017 CONSULTATION DATE:  10/20/2017  REFERRING MD :  Marcelle OverlieMichelle Grewal  CHIEF COMPLAINT:  Asthma exacerbation  BRIEF PATIENT DESCRIPTION:  Awake and alert, in NAD  SIGNIFICANT EVENTS  10/19/2017: Admission to [facility] for asthma exacerbation   STUDIES:  10/19/2017>> CXR FINDINGS: The heart size and mediastinal contours are within normal limits. Both lungs are clear. The visualized skeletal structures are unremarkable.  IMPRESSION: No active cardiopulmonary disease.   HISTORY OF PRESENT ILLNESS:   32 year old G 2 P 1001 at 30 weeks with asthma presents with asthma exacerbation.She is a previous smoker, quit 10/2016. She was admitted to [facility] 1 year ago for 7 days with an asthma exacerbation. She has not seen a pulmonary MD in years.  She states she has had issues with her asthma with  this pregnancy ( almost entire pregnancy). She was not using maintenance therapy, but she was using her rescue inhaler approximately twice daily every day. She developed  what she thought was bronchitis. . She had a cough which was productive for thick green secretions. She was treated with a Z pack and a pred taper ( 6 days). She states she is no lomger coughing up discolored secretions.She states she did feel better for a short time after that.She was started in a maintenance inhaler  (Pulmicort) about 1 week ago by her OB. She has been treated over past few weeks with inhalers and po steroids but 4/25  In the OB  office she was audibly wheezing and SOB.She was sent to [facility] by her OB/GYN after her OV. Pulmonary was consulted for assistance with managemen of asthma exacerbationt.     PAST MEDICAL HISTORY :   has a past medical history of Anxiety, Asthma, chlamydia infection, varicella, and Trichomonas contact, treated.  has a past surgical history that includes Cholecystectomy and Cesarean  section (N/A, 12/01/2012). Prior to Admission medications   Medication Sig Start Date End Date Taking? Authorizing Provider  acetaminophen (TYLENOL) 500 MG tablet Take 1,000 mg by mouth every 6 (six) hours as needed for pain (For headache.).   Yes [provider]  albuterol (PROVENTIL HFA;VENTOLIN HFA) 108 (90 Base) MCG/ACT inhaler Inhale 2 puffs into the lungs every 4 (four) hours as needed for wheezing or shortness of breath.    Yes [provider]  azithromycin (ZITHROMAX) 250 MG tablet Take 250 mg by mouth daily.   Yes [provider]  budesonide (PULMICORT) 180 MCG/ACT inhaler Inhale 2 puffs into the lungs 2 (two) times daily.   Yes [provider]  loratadine (CLARITIN) 10 MG tablet Take 10 mg by mouth daily.   Yes [provider]  predniSONE (DELTASONE) 20 MG tablet Take 20 mg by mouth daily.   Yes [provider]  Prenatal Vit-Fe Fumarate-FA (PRENATAL MULTIVITAMIN) TABS tablet Take 1 tablet by mouth daily at 12 noon.   Yes [provider]   Allergies  Allergen Reactions  . Latex Rash    FAMILY HISTORY:  family history includes Diabetes in her father, maternal grandfather, and paternal grandmother; Heart attack in her maternal grandfather; Hypertension in her mother and paternal grandfather. SOCIAL HISTORY:  reports that she has been smoking cigarettes.  She has a 10.00 pack-year smoking history. She has never used smokeless tobacco. She reports that she does not drink alcohol or use drugs.  REVIEW OF SYSTEMS:   Constitutional: Negative for fever, chills,  weight loss, malaise/fatigue and diaphoresis.  HENT: Negative for hearing loss, ear pain, nosebleeds, congestion, sore throat, neck pain, tinnitus and ear discharge.   Eyes: Negative for blurred vision, double vision, photophobia, pain, discharge and redness.  Respiratory: Negative for cough at present,No  hemoptysis, rare sputum production, occasional shortness of breath,  + wheezing and no stridor.   Cardiovascular: Negative for chest pain, palpitations, orthopnea, claudication, leg swelling and PND.  Gastrointestinal: Negative for heartburn, nausea, vomiting, abdominal pain, diarrhea, constipation, blood in stool and melena.  Genitourinary: Negative for dysuria, urgency, frequency, hematuria and flank pain.  Musculoskeletal: Negative for myalgias, back pain, joint pain and falls.  Skin: Negative for itching and rash.  Neurological: Negative for dizziness, tingling, tremors, sensory change, speech change, focal weakness, seizures, loss of consciousness, weakness and headaches.  Endo/Heme/Allergies: Negative for environmental allergies and polydipsia. Does not bruise/bleed easily.  SUBJECTIVE:  States she is feeling better after addition of IV steroids, shortness of breath is better.Feels she is wheezing less.  VITAL SIGNS: Temp:  [97.9 F (36.6 C)-99.6 F (37.6 C)] 97.9 F (36.6 C) (04/26 0742) Pulse Rate:  [83-94] 94 (04/26 0742) Resp:  [17-20] 19 (04/26 0742) BP: (100-129)/(51-71) 110/51 (04/26 0742) SpO2:  [94 %-100 %] 99 % (04/26 0742)  PHYSICAL EXAMINATION: General:  Awake and alert, in NAD, On Room Air Neuro:  Cranial nerves intact, MAE x 4, A&O x 3 HEENT:  NCAT, faint  upper airway wheeze noted Cardiovascular:  S1, S2, RRR, No RMG Lungs: Few expiratory wheezes noted, Bilateral chest excursion, otherwise clear throughout Abdomen: [redacted] weeks pregnant, BS +, NT Musculoskeletal: No obvious signs of deformity Skin: Warm dry and intact, brisk cap refill  No results for input(s): NA, K, CL, CO2, BUN, CREATININE, GLUCOSE in the last 168 hours. No results for input(s): HGB, HCT, WBC, PLT in the last 168 hours. Dg Chest 2 View  Result Date: 10/19/2017 CLINICAL DATA:  Shortness of breath and wheezing EXAM: CHEST - 2 VIEW COMPARISON:  08/30/2012 FINDINGS: The heart size and mediastinal contours are within normal limits. Both lungs are clear. The  visualized skeletal structures are unremarkable. IMPRESSION: No active cardiopulmonary disease. Electronically Signed   By: Jasmine Pang M.D.   On: 10/19/2017 18:52    ASSESSMENT / PLAN:  Acute asthma exacerbation in a 32 year old G 2 P 1001 at 30 weeks. Trigger most likely recent URI, and allergies Recent bronchitis and treatment with Z-pack and pred taper. No pulmonary follow up in " years" per patient Started Pulmicort inhaler about 1 week ago. States she has been taking Claritin daily  Plan: Continue IV steroids , decrease to every 12 hour dosing after 10 am dose today Continue Z pack as ordered Starting today after 10 am dose  decrease to 60 mg every  12 hours x 2 doses , then transition to PO prednisone for discharge home. Call if patient worsens after decrease in IV steroid Recommend a longer pred taper upon discharge, suggest 40 x 4 days, 30 x 4 days, 20 x 4 days, 10 x 4 days, then stop. Continue Pulmicort  And Duonebs as ordered Home on Pulmicort inhaler twice daily with rescue for use as needed, we will reassess maintenance regimen at follow up. Follow up with Pulmonary within 1 week of discharge Appointment scheduled for 11/01/2017 at 4:15 pm with Kandice Robinsons, NP Monitor blood sugars while on prednisone, fasting CBG every am Monitor Mag, goal is to keep > 2 Continue Claritin daily  APP Time  for consult 45 minutes   Bevelyn Ngo, AGACNP- [facility] number [phone]  10/20/2017, 8:32 AM

## 2017-10-20 NOTE — Plan of Care (Signed)
  Problem: Education: Goal: Knowledge of General Education information will improve Outcome: Progressing   Problem: Activity: Goal: Risk for activity intolerance will decrease Outcome: Progressing   Problem: Coping: Goal: Level of anxiety will decrease Outcome: Progressing   

## 2017-10-21 LAB — COMPREHENSIVE METABOLIC PANEL WITH GFR
ALT: 9 U/L — ABNORMAL LOW (ref 14–54)
AST: 13 U/L — ABNORMAL LOW (ref 15–41)
Albumin: 2.5 g/dL — ABNORMAL LOW (ref 3.5–5.0)
Alkaline Phosphatase: 55 U/L (ref 38–126)
Anion gap: 8 (ref 5–15)
BUN: 10 mg/dL (ref 6–20)
CO2: 21 mmol/L — ABNORMAL LOW (ref 22–32)
Calcium: 8.4 mg/dL — ABNORMAL LOW (ref 8.9–10.3)
Chloride: 109 mmol/L (ref 101–111)
Creatinine, Ser: 0.49 mg/dL (ref 0.44–1.00)
GFR calc Af Amer: >60 mL/min
GFR calc non Af Amer: >60 mL/min
Glucose, Bld: 131 mg/dL — ABNORMAL HIGH (ref 65–99)
Potassium: 4.5 mmol/L (ref 3.5–5.1)
Sodium: 138 mmol/L (ref 135–145)
Total Bilirubin: 0.2 mg/dL — ABNORMAL LOW (ref 0.3–1.2)
Total Protein: 6.1 g/dL — ABNORMAL LOW (ref 6.5–8.1)

## 2017-10-21 LAB — MAGNESIUM: Magnesium: 1.7 mg/dL (ref 1.7–2.4)

## 2017-10-21 MED ORDER — GLYBURIDE 5 MG PO TABS: 5.0000 mg | ORAL_TABLET | Freq: Two times a day (BID) | ORAL | 0 refills | Status: DC

## 2017-10-21 MED ORDER — PREDNISONE 10 MG (21) PO TBPK: ORAL_TABLET | ORAL | 0 refills | Status: DC

## 2017-10-21 NOTE — Progress Notes (Signed)

## 2017-10-21 NOTE — Discharge Summary (Signed)
Physician Discharge Summary  Patient ID: Brittany Gordon MRN: [id number] DOB/AGE: 09-03-1985 32 y.o.  Admit date: 10/19/2017 Discharge date: 10/21/2017  Admission Diagnoses: asthma exacerbation  Discharge Diagnoses:  Active Problems:   Asthma exacerbation   Moderate asthma with acute exacerbation   Shortness of breath   Hypoxemia   Discharged Condition: stable  Hospital Course: pt admitted with SOB and wheezing.  Treated with IV steroids & nebs.  Symptoms improved and pt stable for discharge on HD#2.  Discharged home on steroid taper and close follow up.    Consults: pulmonary/intensive care  Significant Diagnostic Studies: labs: cbc, cmet and radiology: X-Ray: chest  Treatments: respiratory therapy: albuterol/atropine nebulizer  Discharge Exam: Blood pressure (!) 82/40, pulse 78, temperature 98 F (36.7 C), temperature source Oral, resp. rate 18, height  (1.651 m), weight 236 lb (107 kg), SpO2 98 %. General appearance: alert and cooperative Resp: clear to auscultation bilaterally GI: gravid, NT  Disposition: Discharge disposition: 01-Home or Self Care        Allergies as of 10/21/2017      Reactions   Latex Rash      Medication List    STOP taking these medications   predniSONE 20 MG tablet Commonly known as:  DELTASONE Replaced by:  predniSONE 10 MG (21) Tbpk tablet     TAKE these medications   acetaminophen 500 MG tablet Commonly known as:  TYLENOL Take 1,000 mg by mouth every 6 (six) hours as needed for pain (For headache.).   albuterol 108 (90 Base) MCG/ACT inhaler Commonly known as:  PROVENTIL HFA;VENTOLIN HFA Inhale 2 puffs into the lungs every 4 (four) hours as needed for wheezing or shortness of breath.   azithromycin 250 MG tablet Commonly known as:  ZITHROMAX Take 250 mg by mouth daily.   budesonide 180 MCG/ACT inhaler Commonly known as:  PULMICORT Inhale 2 puffs into the lungs 2 (two) times daily.   glyBURIDE 5 MG tablet Commonly  known as:  DIABETA Take 1 tablet (5 mg total) by mouth 2 (two) times daily with a meal.   loratadine 10 MG tablet Commonly known as:  CLARITIN Take 10 mg by mouth daily.   predniSONE 10 MG (21) Tbpk tablet Commonly known as:  STERAPRED UNI-PAK 21 TAB  po qd x 4 days,  po qd x 4 days,  po qd x 4 days,  po qd x 4 days Replaces:  predniSONE 20 MG tablet   prenatal multivitamin Tabs tablet Take 1 tablet by mouth daily at 12 noon.      Follow-up Information    Bevelyn Ngo, NP. Go to.   Specialty:  Pulmonary Disease Why:  May 8th at 4:15 pm Contact information: [address] [place] [address] [phone]        Ginette Otto, [facility] Schedule an appointment as soon as possible for a visit in 1 week(s).   Contact information: [address] [place] [address] [phone]           Signed: Zelphia Cairo 10/21/2017, 9:01 AM

## 2017-11-01 ENCOUNTER — Encounter: Payer: Self-pay | Admitting: Acute Care

## 2017-11-01 ENCOUNTER — Ambulatory Visit: Payer: Managed Care, Other (non HMO) | Admitting: Acute Care

## 2017-11-01 VITALS — BP 110/60 | HR 94 | Ht 65.0 in | Wt 236.4 lb

## 2017-11-01 DIAGNOSIS — R0602 Shortness of breath: Secondary | ICD-10-CM

## 2017-11-01 DIAGNOSIS — J45901 Unspecified asthma with (acute) exacerbation: Secondary | ICD-10-CM

## 2017-11-01 LAB — POCT EXHALED NITRIC OXIDE: FeNO level (ppb): 91

## 2017-11-01 MED ORDER — LEVALBUTEROL HCL 0.63 MG/3ML IN NEBU
0.6300 mg | INHALATION_SOLUTION | Freq: Once | RESPIRATORY_TRACT | Status: AC
  Administered 2017-11-01: 0.63 mg via RESPIRATORY_TRACT

## 2017-11-01 MED ORDER — PREDNISONE 10 MG PO TABS: 10.0000 mg | ORAL_TABLET | Freq: Every day | ORAL | 0 refills | Status: DC

## 2017-11-01 MED ORDER — BUDESONIDE-FORMOTEROL FUMARATE 160-4.5 MCG/ACT IN AERO: 2.0000 | INHALATION_SPRAY | Freq: Two times a day (BID) | RESPIRATORY_TRACT | 6 refills | Status: DC

## 2017-11-01 NOTE — Patient Instructions (Signed)
It is goo to see you today. Xopenex treatment now. FENO>> 91ppb Continue claritin 10 mg  in the morning. Symbicort 160 2 puffs twice daily  Rinse mouth after use. Continue your rescue inhaler for breakthrough shortness of breath. If you don't feel this is working call the office and we will order neb treatments. We will continue prednisone 10 mg daily for an additional 4 days ( Total of 8 days starting tomorrow) Follow up in 4 weeks with Maralyn Sago NP Please contact office for sooner follow up if symptoms do not improve or worsen or seek emergency care

## 2017-11-01 NOTE — Progress Notes (Signed)
History of Present Illness Brittany Gordon is a 32 y.o. female  , [redacted] weeks pregnant with Moderate asthma.She was seen as an inpatient for exacerbation  by S. Alexandria Lodge, NP and Dr. Molli Knock.   11/01/2017 Hospital Follow up Pt. Presents for hospital  follow up.She was admitted to [facility] hospital ( [redacted] weeks gestation at time of admission)with an acute asthma exacerbation 4/25-4/27. She was treated with IV steroids, scheduled BD, and IV antibiotics. She improved and was discharged home on Pulmicort inhaler twice daily and a slow prednisone taper. She presents today stating she has been doing " ok". She said she is continuing to wheeze despite continued treatment with prednisone ( She took her last 20 mg dose today, and drops to 10 mg dose x 4 days tomorrow  5/9). She thinks this is just something she will have to tolerate until the baby is born.She states that in her non-pregnant state she rarely has to use her inhaler. She is very wheezy today. FENO is 91ppb. She denies fever, chest pain, or hemoptysis. She states she is compliant with Pulmicort inhaler. She states she has not been using her rescue inhaler. She denies fever, chest pain, or hemoptysis.   Test Results: 11/01/2017>> FENO 91PPB  CBC Latest Ref Rng & Units 10/20/2017 12/02/2012 11/29/2012  WBC 4.0 - 10.5 K/uL 25.4(H) 19.8(H) 14.7(H)  Hemoglobin 12.0 - 15.0 g/dL 10.5(L) 9.4(L) 10.9(L)  Hematocrit 36.0 - 46.0 % 31.7(L) 28.6(L) 32.7(L)  Platelets 150 - 400 K/uL 247 202 225    BMP Latest Ref Rng & Units 10/21/2017 10/20/2017  Glucose 65 - 99 mg/dL 130(Q) 657(Q)  BUN 6 - 20 mg/dL 10 9  Creatinine 4.69 - 1.00 mg/dL 6.29 5.28  Sodium 413 - 145 mmol/L 138 137  Potassium 3.5 - 5.1 mmol/L 4.5 4.3  Chloride 101 - 111 mmol/L 109 108  CO2 22 - 32 mmol/L 21(L) 19(L)  Calcium 8.9 - 10.3 mg/dL 2.4(M) 0.1(U)    BNP No results found for: BNP  ProBNP No results found for: PROBNP  PFT No results found for: FEV1PRE, FEV1POST, FVCPRE, FVCPOST, TLC, DLCOUNC,  PREFEV1FVCRT, PSTFEV1FVCRT  Dg Chest 2 View  Result Date: 10/19/2017 CLINICAL DATA:  Shortness of breath and wheezing EXAM: CHEST - 2 VIEW COMPARISON:  08/30/2012 FINDINGS: The heart size and mediastinal contours are within normal limits. Both lungs are clear. The visualized skeletal structures are unremarkable. IMPRESSION: No active cardiopulmonary disease. Electronically Signed   By: Jasmine Pang M.D.   On: 10/19/2017 18:52     Past medical hx Past Medical History:  Diagnosis Date  . Anxiety    no meds during pregnancy  . Asthma    as child  . Hx of chlamydia infection   . Hx of varicella   . Trichomonas contact, treated      Social History   Tobacco Use  . Smoking status: Former Smoker    Packs/day: 1.00    Years: 10.00    Pack years: 10.00    Types: Cigarettes  . Smokeless tobacco: Never Used  Substance Use Topics  . Alcohol use: No  . Drug use: No    Ms.Capelli reports that she has quit smoking. Her smoking use included cigarettes. She has a 10.00 pack-year smoking history. She has never used smokeless tobacco. She reports that she does not drink alcohol or use drugs.  Tobacco Cessation: Former smoker ( 1ppd x 10 years)  Past surgical hx, Family hx, Social hx all reviewed.  Current Outpatient Medications  on File Prior to Visit  Medication Sig  . acetaminophen (TYLENOL) 500 MG tablet Take 1,000 mg by mouth every 6 (six) hours as needed for pain (For headache.).  Marland Kitchen albuterol (PROVENTIL HFA;VENTOLIN HFA) 108 (90 Base) MCG/ACT inhaler Inhale 2 puffs into the lungs every 4 (four) hours as needed for wheezing or shortness of breath.   . budesonide (PULMICORT) 180 MCG/ACT inhaler Inhale 2 puffs into the lungs 2 (two) times daily.  Marland Kitchen glyBURIDE (DIABETA) 5 MG tablet Take 1 tablet (5 mg total) by mouth 2 (two) times daily with a meal.  . loratadine (CLARITIN) 10 MG tablet Take 10 mg by mouth daily.  . predniSONE (STERAPRED UNI-PAK 21 TAB) 10 MG (21) TBPK tablet  po qd x  4 days,  po qd x 4 days,  po qd x 4 days,  po qd x 4 days  . Prenatal Vit-Fe Fumarate-FA (PRENATAL MULTIVITAMIN) TABS tablet Take 1 tablet by mouth daily at 12 noon.   No current facility-administered medications on file prior to visit.      Allergies  Allergen Reactions  . Latex Rash    Review Of Systems:  Constitutional:   No  weight loss, night sweats,  Fevers, chills, fatigue, or  lassitude.  HEENT:   No headaches,  Difficulty swallowing,  Tooth/dental problems, or  Sore throat,                No sneezing, itching, ear ache, nasal congestion, post nasal drip,   CV:  No chest pain, + chest tightness,  No Orthopnea, PND, + swelling in lower extremities, anasarca, dizziness, palpitations, syncope.   GI  No heartburn, + indigestion, abdominal pain, nausea, vomiting, diarrhea, change in bowel habits, loss of appetite, bloody stools.   Resp: + shortness of breath with exertion less at rest.  No excess mucus, no productive cough,  + non-productive cough,  No coughing up of blood.  No change in color of mucus.  + wheezing.  No chest wall deformity  Skin: no rash or lesions.  GU: no dysuria, change in color of urine, no urgency or frequency.  No flank pain, no hematuria   MS:  No joint pain or swelling.  No decreased range of motion.  No back pain.  Psych:  No change in mood or affect. No depression or anxiety.  No memory loss.   Vital Signs BP 110/60 (BP Location: Left Arm, Cuff Size: Normal)   Pulse 94   Ht  (1.651 m)   Wt 236 lb 6.4 oz (107.2 kg)   SpO2 98%   BMI 39.34 kg/m    Physical Exam:  General- No distress,  A&Ox3, pleasant [redacted] week pregnant female ENT: No sinus tenderness, TM clear, pale nasal mucosa, no oral exudate,no post nasal drip, no LAN Cardiac: S1, S2, regular rate and rhythm, no murmur Chest: + Insp/Exp  wheeze/ No rales/ dullness; no accessory muscle use, no nasal flaring, no sternal retractions Abd.: Soft Non-tender, pregnant, abdomen  appears to represent [redacted] week gestation, BS + Ext: No clubbing cyanosis, trace BLE edema ( Pregnancy related) Neuro:  normal strength, MAE x 4, A&O x 3 Skin: No rashes, warm and dry Psych: normal mood and behavior   Assessment/Plan  Asthma exacerbation Continues to wheeze despite compliance with prednisone taper and pulmicort inhaler. States she feels this flare is all 2/2 pregnancy Pre-pregnancy she rarely used her inhaler. Plan: Xopenex treatment now. FENO>> 91ppb Continue claritin 10 mg  in the morning. Symbicort 160  2 puffs twice daily  Stop Pulmicort inhaler  Rinse mouth after use. Continue your rescue inhaler for breakthrough shortness of breath. If you don't feel this is working call the office and we will order neb treatments. We will continue prednisone 10 mg daily for an additional 4 days ( Total of 8 days starting tomorrow) Follow up in 4 weeks with Maralyn Sago NP Please contact office for sooner follow up if symptoms do not improve or worsen or seek emergency care  We discussed need to seek emergency care if this worsens acutely.    Bevelyn Ngo, NP 11/01/2017  10:58 PM

## 2017-11-01 NOTE — Progress Notes (Signed)
pred 

## 2017-11-01 NOTE — Assessment & Plan Note (Addendum)
Continues to wheeze despite compliance with prednisone taper and pulmicort inhaler. States she feels this flare is all 2/2 pregnancy Pre-pregnancy she rarely used her inhaler. Plan: Xopenex treatment now. FENO>> 91ppb Continue claritin 10 mg  in the morning. Symbicort 160 2 puffs twice daily  Stop Pulmicort inhaler  Rinse mouth after use. Continue your rescue inhaler for breakthrough shortness of breath. If you don't feel this is working call the office and we will order neb treatments. We will continue prednisone 10 mg daily for an additional 4 days ( Total of 8 days starting tomorrow) Follow up in 4 weeks with Maralyn Sago NP Please contact office for sooner follow up if symptoms do not improve or worsen or seek emergency care  We discussed need to seek emergency care if this worsens acutely.

## 2017-11-02 DIAGNOSIS — Z3493 Encounter for supervision of normal pregnancy, unspecified, third trimester: Secondary | ICD-10-CM | POA: Diagnosis not present

## 2017-11-02 DIAGNOSIS — Z3A32 32 weeks gestation of pregnancy: Secondary | ICD-10-CM | POA: Diagnosis not present

## 2017-11-02 DIAGNOSIS — O368330 Maternal care for abnormalities of the fetal heart rate or rhythm, third trimester, not applicable or unspecified: Secondary | ICD-10-CM | POA: Diagnosis not present

## 2017-11-09 DIAGNOSIS — Z3A33 33 weeks gestation of pregnancy: Secondary | ICD-10-CM | POA: Diagnosis not present

## 2017-11-09 DIAGNOSIS — O368330 Maternal care for abnormalities of the fetal heart rate or rhythm, third trimester, not applicable or unspecified: Secondary | ICD-10-CM | POA: Diagnosis not present

## 2017-11-09 DIAGNOSIS — Z3493 Encounter for supervision of normal pregnancy, unspecified, third trimester: Secondary | ICD-10-CM | POA: Diagnosis not present

## 2017-11-30 DIAGNOSIS — Z3685 Encounter for antenatal screening for Streptococcus B: Secondary | ICD-10-CM | POA: Diagnosis not present

## 2017-12-01 LAB — OB RESULTS CONSOLE GBS: GBS: POSITIVE

## 2017-12-04 ENCOUNTER — Ambulatory Visit: Payer: Self-pay | Admitting: Acute Care

## 2017-12-04 NOTE — Progress Notes (Deleted)
History of Present Illness Brittany Gordon is a 32 y.o. female 6936 weeks pregnant with Moderate asthma.She was seen as an inpatient for exacerbation  by S. Alexandria LodgeGroce, NP and Dr. Molli KnockYacoub.     12/04/2017 4 week follow up: Pt.presents for follow up. She was last seen 11/01/2017 . At that time she was continuing to flare.Treatment after that visit was as follows:  Xopenex treatment now. FENO>> 91ppb Continue claritin 10 mg  in the morning. Symbicort 160 2 puffs twice daily  Stop Pulmicort inhaler  Rinse mouth after use. Continue your rescue inhaler for breakthrough shortness of breath. If you don't feel this is working call the office and we will order neb treatments. We will continue prednisone 10 mg daily for an additional 4 days ( Total of 8 days starting tomorrow) Follow up in 4 weeks with Maralyn SagoSarah NP Please contact office for sooner follow up if symptoms do not improve or worsen or seek emergency care  We discussed need to seek emergency care if this worsens acutely.   Pt.presents for follow up. She  states she has been doing well after the additional prednisone taper.  Test Results:  CBC Latest Ref Rng & Units 10/20/2017 12/02/2012 11/29/2012  WBC 4.0 - 10.5 K/uL 25.4(H) 19.8(H) 14.7(H)  Hemoglobin 12.0 - 15.0 g/dL 10.5(L) 9.4(L) 10.9(L)  Hematocrit 36.0 - 46.0 % 31.7(L) 28.6(L) 32.7(L)  Platelets 150 - 400 K/uL 247 202 225    BMP Latest Ref Rng & Units 10/21/2017 10/20/2017  Glucose 65 - 99 mg/dL 696(E131(H) 952(W195(H)  BUN 6 - 20 mg/dL 10 9  Creatinine 4.130.44 - 1.00 mg/dL 2.440.49 0.100.58  Sodium 272135 - 145 mmol/L 138 137  Potassium 3.5 - 5.1 mmol/L 4.5 4.3  Chloride 101 - 111 mmol/L 109 108  CO2 22 - 32 mmol/L 21(L) 19(L)  Calcium 8.9 - 10.3 mg/dL 5.3(G8.4(L) 6.4(Q8.6(L)    BNP No results found for: BNP  ProBNP No results found for: PROBNP  PFT No results found for: FEV1PRE, FEV1POST, FVCPRE, FVCPOST, TLC, DLCOUNC, PREFEV1FVCRT, PSTFEV1FVCRT  No results found.   Past medical hx Past Medical History:   Diagnosis Date  . Anxiety    no meds during pregnancy  . Asthma    as child  . Hx of chlamydia infection   . Hx of varicella   . Trichomonas contact, treated      Social History   Tobacco Use  . Smoking status: Former Smoker    Packs/day: 1.00    Years: 10.00    Pack years: 10.00    Types: Cigarettes  . Smokeless tobacco: Never Used  Substance Use Topics  . Alcohol use: No  . Drug use: No    Ms.Wiegand reports that she has quit smoking. Her smoking use included cigarettes. She has a 10.00 pack-year smoking history. She has never used smokeless tobacco. She reports that she does not drink alcohol or use drugs.  Tobacco Cessation: Counseling given: Not Answered   Past surgical hx, Family hx, Social hx all reviewed.  Current Outpatient Medications on File Prior to Visit  Medication Sig  . acetaminophen (TYLENOL) 500 MG tablet Take 1,000 mg by mouth every 6 (six) hours as needed for pain (For headache.).  Marland Kitchen. albuterol (PROVENTIL HFA;VENTOLIN HFA) 108 (90 Base) MCG/ACT inhaler Inhale 2 puffs into the lungs every 4 (four) hours as needed for wheezing or shortness of breath.   . budesonide (PULMICORT) 180 MCG/ACT inhaler Inhale 2 puffs into the lungs 2 (two) times daily.  .Marland Kitchen  budesonide-formoterol (SYMBICORT) 160-4.5 MCG/ACT inhaler Inhale 2 puffs into the lungs 2 (two) times daily.  Marland Kitchen glyBURIDE (DIABETA) 5 MG tablet Take 1 tablet (5 mg total) by mouth 2 (two) times daily with a meal.  . loratadine (CLARITIN) 10 MG tablet Take 10 mg by mouth daily.  . predniSONE (DELTASONE) 10 MG tablet Take 1 tablet (10 mg total) by mouth daily with breakfast.  . predniSONE (STERAPRED UNI-PAK 21 TAB) 10 MG (21) TBPK tablet 40mg  po qd x 4 days, 30mg  po qd x 4 days, 20mg  po qd x 4 days, 10mg  po qd x 4 days  . Prenatal Vit-Fe Fumarate-FA (PRENATAL MULTIVITAMIN) TABS tablet Take 1 tablet by mouth daily at 12 noon.   No current facility-administered medications on file prior to visit.      Allergies    Allergen Reactions  . Latex Rash    Review Of Systems:  Constitutional:   No  weight loss, night sweats,  Fevers, chills, fatigue, or  lassitude.  HEENT:   No headaches,  Difficulty swallowing,  Tooth/dental problems, or  Sore throat,                No sneezing, itching, ear ache, nasal congestion, post nasal drip,   CV:  No chest pain,  Orthopnea, PND, swelling in lower extremities, anasarca, dizziness, palpitations, syncope.   GI  No heartburn, indigestion, abdominal pain, nausea, vomiting, diarrhea, change in bowel habits, loss of appetite, bloody stools.   Resp: No shortness of breath with exertion or at rest.  No excess mucus, no productive cough,  No non-productive cough,  No coughing up of blood.  No change in color of mucus.  No wheezing.  No chest wall deformity  Skin: no rash or lesions.  GU: no dysuria, change in color of urine, no urgency or frequency.  No flank pain, no hematuria   MS:  No joint pain or swelling.  No decreased range of motion.  No back pain.  Psych:  No change in mood or affect. No depression or anxiety.  No memory loss.   Vital Signs There were no vitals taken for this visit.   Physical Exam:  General- No distress,  A&Ox3 ENT: No sinus tenderness, TM clear, pale nasal mucosa, no oral exudate,no post nasal drip, no LAN Cardiac: S1, S2, regular rate and rhythm, no murmur Chest: No wheeze/ rales/ dullness; no accessory muscle use, no nasal flaring, no sternal retractions Abd.: Soft Non-tender Ext: No clubbing cyanosis, edema Neuro:  normal strength Skin: No rashes, warm and dry Psych: normal mood and behavior   Assessment/Plan  No problem-specific Assessment & Plan notes found for this encounter.    Bevelyn Ngo, NP 12/04/2017  10:18 AM

## 2017-12-14 ENCOUNTER — Telehealth (HOSPITAL_COMMUNITY): Payer: Self-pay | Admitting: *Deleted

## 2017-12-14 ENCOUNTER — Encounter (HOSPITAL_COMMUNITY): Payer: Self-pay

## 2017-12-14 NOTE — Telephone Encounter (Signed)
Preadmission screen  

## 2017-12-15 ENCOUNTER — Encounter (HOSPITAL_COMMUNITY): Payer: Self-pay

## 2017-12-26 NOTE — Patient Instructions (Signed)
Brittany Gordon  12/26/2017   Your procedure is scheduled on:  12/29/2017  [facility] at 0615 AM.  Pick up the phone at the desk and dial [phone]  Call this number if you have problems the morning of [phone]  Remember:   Do not eat food:(After Midnight) Desps de medianoche.  Do not drink clear liquids: (After Midnight) Desps de medianoche.  Take these medicines the morning of surgery with A SIP OF WATER: none   Do not wear jewelry, make-up or nail polish.  Do not wear lotions, powders, or perfumes. Do not wear deodorant.  Do not shave 48 hours prior to surgery.  Do not bring valuables to the hospital.  [facility] is not   responsible for any belongings or valuables brought to the hospital.  Contacts, dentures or bridgework may not be worn into surgery.  Leave suitcase in the car. After surgery it may be brought to your room.  For patients admitted to the hospital, checkout time is 11:00 AM the day of              discharge.    N/A   Please read over the following fact sheets that you were given:   Surgical Site Infection Prevention

## 2017-12-27 ENCOUNTER — Encounter (HOSPITAL_COMMUNITY)
Admission: RE | Admit: 2017-12-27 | Discharge: 2017-12-27 | Disposition: A | Discharge status: Home / Self Care | Payer: 59 | Source: Ambulatory Visit | Attending: Obstetrics and Gynecology | Admitting: Obstetrics and Gynecology

## 2017-12-27 HISTORY — DX: Headache, unspecified: R51.9

## 2017-12-27 HISTORY — DX: Headache: R51

## 2017-12-27 LAB — COMPREHENSIVE METABOLIC PANEL
ALK PHOS: 95 U/L (ref 38–126)
ALT: 10 U/L (ref 0–44)
AST: 14 U/L — AB (ref 15–41)
Albumin: 2.6 g/dL — ABNORMAL LOW (ref 3.5–5.0)
Anion gap: 9 (ref 5–15)
BUN: 10 mg/dL (ref 6–20)
CALCIUM: 8.6 mg/dL — AB (ref 8.9–10.3)
CO2: 20 mmol/L — ABNORMAL LOW (ref 22–32)
CREATININE: 0.49 mg/dL (ref 0.44–1.00)
Chloride: 106 mmol/L (ref 98–111)
Glucose, Bld: 108 mg/dL — ABNORMAL HIGH (ref 70–99)
Potassium: 4 mmol/L (ref 3.5–5.1)
Sodium: 135 mmol/L (ref 135–145)
TOTAL PROTEIN: 6.4 g/dL — AB (ref 6.5–8.1)
Total Bilirubin: 0.5 mg/dL (ref 0.3–1.2)

## 2017-12-27 LAB — CBC
HCT: 33.4 % — ABNORMAL LOW (ref 36.0–46.0)
Hemoglobin: 11.1 g/dL — ABNORMAL LOW (ref 12.0–15.0)
MCH: 28.1 pg (ref 26.0–34.0)
MCHC: 33.2 g/dL (ref 30.0–36.0)
MCV: 84.6 fL (ref 78.0–100.0)
Platelets: 267 10*3/uL (ref 150–400)
RBC: 3.95 MIL/uL (ref 3.87–5.11)
RDW: 14.2 % (ref 11.5–15.5)
WBC: 11.2 10*3/uL — AB (ref 4.0–10.5)

## 2017-12-27 LAB — TYPE AND SCREEN
ABO/RH(D): A POS
ANTIBODY SCREEN: NEGATIVE

## 2017-12-27 NOTE — H&P (Signed)
Brittany Gordon is a 32 y.o. female presenting for repeat cesarean section and BTL. Pregnancy complicated by asthma with multiple flares. Also right otitis media treated 12/20/17 with Zithromax pack. OB History    Gravida  2   Para  1   Term  1   Preterm      AB      Living  1     SAB      TAB      Ectopic      Multiple      Live Births  1          Past Medical History:  Diagnosis Date  . Anxiety    no meds during pregnancy  . Asthma    as child  . Headache   . Hx of chlamydia infection   . Hx of varicella   . Trichomonas contact, treated    Past Surgical History:  Procedure Laterality Date  . CESAREAN SECTION N/A 12/01/2012   Procedure: primary cesarean section with delivery of baby girl at 50805. Apgars 9/9.;  Surgeon: Freddrick MarchKendra H. Tenny Crawoss, MD;  Location: [facility]S;  Service: Obstetrics;  Laterality: N/A;  . CHOLECYSTECTOMY     Family History: family history includes Colon cancer in her paternal grandfather; Diabetes in her father, maternal grandfather, and paternal grandmother; Heart attack in her maternal grandfather; Heart disease in her paternal grandfather; Hypertension in her maternal aunt, mother, and paternal grandfather; Rheum arthritis in her paternal aunt; Stroke in her paternal grandfather. Social History:  reports that she quit smoking about 13 months ago. Her smoking use included cigarettes. She has a 10.00 pack-year smoking history. She has never used smokeless tobacco. She reports that she does not drink alcohol or use drugs.     Maternal Diabetes: No Genetic Screening: Normal Maternal Ultrasounds/Referrals: Normal Fetal Ultrasounds or other Referrals:  None Maternal Substance Abuse:  No Significant Maternal Medications:  Meds include: Other:  Significant Maternal Lab Results:  None Other Comments:  None  Review of Systems  Constitutional: Negative for fever.  Eyes: Negative for blurred vision.  Gastrointestinal: Negative for abdominal pain.   Neurological: Negative for tingling.   History   There were no vitals taken for this visit. Exam Physical Exam  Cardiovascular: Normal rate and regular rhythm.  Respiratory: Effort normal. She has wheezes.  Mild inspiratory wheezed bilat  GI: Soft. There is no tenderness.  Neurological: She has normal reflexes.    Prenatal labs: ABO, Rh: --/--/A POS (04/25 2049) Antibody: NEG (04/25 2049) Rubella: Immune (11/21 0000) RPR: Nonreactive (11/21 0000)  HBsAg: Negative (11/21 0000)  HIV: Non-reactive (11/21 0000)  GBS: Positive (06/07 0000)   Assessment/Plan: 32 yo G2P1 for repeat C/S and BTL D/W patient risks including infection, organ damage, bleeding/transfusion-HIV/Hep, DVT/PE, pneumonia, wound break down. Reviewed BTL and alternatives, permanence, failure rate and increased ectopic risk.   Roselle LocusJames E Marita Burnsed II 12/27/2017, 1:38 PM

## 2017-12-28 LAB — RPR: RPR: NONREACTIVE

## 2017-12-29 ENCOUNTER — Inpatient Hospital Stay (HOSPITAL_COMMUNITY): Payer: 59 | Admitting: Certified Registered"

## 2017-12-29 ENCOUNTER — Other Ambulatory Visit: Payer: Self-pay

## 2017-12-29 ENCOUNTER — Inpatient Hospital Stay (HOSPITAL_COMMUNITY)
Admission: AD | Admit: 2017-12-29 | Discharge: 2017-12-31 | DRG: 785 | Disposition: A | Discharge status: Home / Self Care | Payer: 59 | Source: Ambulatory Visit | Attending: Obstetrics and Gynecology | Admitting: Obstetrics and Gynecology

## 2017-12-29 ENCOUNTER — Encounter (HOSPITAL_COMMUNITY)
Admission: AD | Disposition: A | Discharge status: Home / Self Care | Payer: Self-pay | Source: Ambulatory Visit | Attending: Obstetrics and Gynecology

## 2017-12-29 ENCOUNTER — Encounter (HOSPITAL_COMMUNITY): Payer: Self-pay

## 2017-12-29 ENCOUNTER — Inpatient Hospital Stay (HOSPITAL_COMMUNITY): Admission: AD | Admit: 2017-12-29 | Payer: Self-pay | Source: Ambulatory Visit | Admitting: Obstetrics and Gynecology

## 2017-12-29 DIAGNOSIS — Z3A40 40 weeks gestation of pregnancy: Secondary | ICD-10-CM

## 2017-12-29 DIAGNOSIS — Z87891 Personal history of nicotine dependence: Secondary | ICD-10-CM

## 2017-12-29 DIAGNOSIS — Z98891 History of uterine scar from previous surgery: Secondary | ICD-10-CM

## 2017-12-29 DIAGNOSIS — O34211 Maternal care for low transverse scar from previous cesarean delivery: Secondary | ICD-10-CM | POA: Diagnosis not present

## 2017-12-29 DIAGNOSIS — R0602 Shortness of breath: Secondary | ICD-10-CM

## 2017-12-29 DIAGNOSIS — R0902 Hypoxemia: Secondary | ICD-10-CM

## 2017-12-29 DIAGNOSIS — O9952 Diseases of the respiratory system complicating childbirth: Secondary | ICD-10-CM | POA: Diagnosis present

## 2017-12-29 DIAGNOSIS — J45909 Unspecified asthma, uncomplicated: Secondary | ICD-10-CM | POA: Diagnosis present

## 2017-12-29 DIAGNOSIS — Z302 Encounter for sterilization: Secondary | ICD-10-CM | POA: Diagnosis not present

## 2017-12-29 DIAGNOSIS — J45901 Unspecified asthma with (acute) exacerbation: Secondary | ICD-10-CM

## 2017-12-29 SURGERY — Surgical Case
Anesthesia: Spinal | Site: Abdomen | Wound class: Clean Contaminated

## 2017-12-29 MED ORDER — MEPERIDINE HCL 25 MG/ML IJ SOLN: 6.2500 mg | INTRAMUSCULAR | Status: DC | PRN

## 2017-12-29 MED ORDER — PROMETHAZINE HCL 25 MG/ML IJ SOLN: 6.2500 mg | INTRAMUSCULAR | Status: DC | PRN

## 2017-12-29 MED ORDER — SCOPOLAMINE 1 MG/3DAYS TD PT72
MEDICATED_PATCH | TRANSDERMAL | Status: AC
  Filled 2017-12-29: qty 1

## 2017-12-29 MED ORDER — NALBUPHINE HCL 10 MG/ML IJ SOLN: 5.0000 mg | Freq: Once | INTRAMUSCULAR | Status: AC | PRN

## 2017-12-29 MED ORDER — FENTANYL CITRATE (PF) 100 MCG/2ML IJ SOLN
INTRAMUSCULAR | Status: DC | PRN
  Administered 2017-12-29: 10 ug via INTRATHECAL

## 2017-12-29 MED ORDER — LACTATED RINGERS IV SOLN
INTRAVENOUS | Status: DC
  Administered 2017-12-29 – 2017-12-30 (×2): via INTRAVENOUS

## 2017-12-29 MED ORDER — SODIUM CHLORIDE 0.9% FLUSH: 3.0000 mL | INTRAVENOUS | Status: DC | PRN

## 2017-12-29 MED ORDER — ACETAMINOPHEN 325 MG PO TABS
650.0000 mg | ORAL_TABLET | ORAL | Status: DC | PRN
  Administered 2017-12-29 – 2017-12-31 (×4): 650 mg via ORAL
  Filled 2017-12-29 (×5): qty 2

## 2017-12-29 MED ORDER — BUPIVACAINE IN DEXTROSE 0.75-8.25 % IT SOLN
INTRATHECAL | Status: DC | PRN
  Administered 2017-12-29: 1.6 mL via INTRATHECAL

## 2017-12-29 MED ORDER — IBUPROFEN 600 MG PO TABS
600.0000 mg | ORAL_TABLET | Freq: Four times a day (QID) | ORAL | Status: DC
  Administered 2017-12-29 – 2017-12-31 (×8): 600 mg via ORAL
  Filled 2017-12-29 (×8): qty 1

## 2017-12-29 MED ORDER — TETANUS-DIPHTH-ACELL PERTUSSIS 5-2.5-18.5 LF-MCG/0.5 IM SUSP: 0.5000 mL | Freq: Once | INTRAMUSCULAR | Status: DC

## 2017-12-29 MED ORDER — NALBUPHINE HCL 10 MG/ML IJ SOLN
5.0000 mg | Freq: Once | INTRAMUSCULAR | Status: AC | PRN
  Administered 2017-12-29: 5 mg via INTRAVENOUS

## 2017-12-29 MED ORDER — DIPHENHYDRAMINE HCL 25 MG PO CAPS: 25.0000 mg | ORAL_CAPSULE | ORAL | Status: DC | PRN

## 2017-12-29 MED ORDER — ALBUTEROL SULFATE (2.5 MG/3ML) 0.083% IN NEBU: 2.5000 mg | INHALATION_SOLUTION | RESPIRATORY_TRACT | Status: DC | PRN

## 2017-12-29 MED ORDER — NALBUPHINE HCL 10 MG/ML IJ SOLN
5.0000 mg | INTRAMUSCULAR | Status: DC | PRN
  Administered 2017-12-29: 5 mg via INTRAVENOUS
  Filled 2017-12-29 (×2): qty 1

## 2017-12-29 MED ORDER — PHENYLEPHRINE 8 MG IN D5W 100 ML (0.08MG/ML) PREMIX OPTIME
INJECTION | INTRAVENOUS | Status: DC | PRN
  Administered 2017-12-29: 40 ug/min via INTRAVENOUS

## 2017-12-29 MED ORDER — LACTATED RINGERS IV SOLN
INTRAVENOUS | Status: DC | PRN
  Administered 2017-12-29: 09:00:00 via INTRAVENOUS

## 2017-12-29 MED ORDER — MORPHINE SULFATE (PF) 0.5 MG/ML IJ SOLN
INTRAMUSCULAR | Status: DC | PRN
  Administered 2017-12-29: .2 mg via INTRATHECAL

## 2017-12-29 MED ORDER — OXYTOCIN 10 UNIT/ML IJ SOLN
INTRAMUSCULAR | Status: AC
  Filled 2017-12-29: qty 1

## 2017-12-29 MED ORDER — DIBUCAINE 1 % RE OINT: 1.0000 "application " | TOPICAL_OINTMENT | RECTAL | Status: DC | PRN

## 2017-12-29 MED ORDER — PHENYLEPHRINE 8 MG IN D5W 100 ML (0.08MG/ML) PREMIX OPTIME
INJECTION | INTRAVENOUS | Status: AC
  Filled 2017-12-29: qty 100

## 2017-12-29 MED ORDER — ALBUTEROL SULFATE HFA 108 (90 BASE) MCG/ACT IN AERS: 2.0000 | INHALATION_SPRAY | RESPIRATORY_TRACT | Status: DC | PRN

## 2017-12-29 MED ORDER — SCOPOLAMINE 1 MG/3DAYS TD PT72
1.0000 | MEDICATED_PATCH | Freq: Once | TRANSDERMAL | Status: DC
  Administered 2017-12-29: 1.5 mg via TRANSDERMAL

## 2017-12-29 MED ORDER — MOMETASONE FURO-FORMOTEROL FUM 200-5 MCG/ACT IN AERO
2.0000 | INHALATION_SPRAY | Freq: Two times a day (BID) | RESPIRATORY_TRACT | Status: DC
  Filled 2017-12-29: qty 8.8

## 2017-12-29 MED ORDER — SENNOSIDES-DOCUSATE SODIUM 8.6-50 MG PO TABS
2.0000 | ORAL_TABLET | ORAL | Status: DC
  Administered 2017-12-29 – 2017-12-30 (×2): 2 via ORAL
  Filled 2017-12-29 (×2): qty 2

## 2017-12-29 MED ORDER — NALOXONE HCL 0.4 MG/ML IJ SOLN: 0.4000 mg | INTRAMUSCULAR | Status: DC | PRN

## 2017-12-29 MED ORDER — OXYCODONE HCL 5 MG PO TABS: 10.0000 mg | ORAL_TABLET | ORAL | Status: DC | PRN

## 2017-12-29 MED ORDER — NALOXONE HCL 4 MG/10ML IJ SOLN
1.0000 ug/kg/h | INTRAMUSCULAR | Status: DC | PRN
  Filled 2017-12-29: qty 5

## 2017-12-29 MED ORDER — ONDANSETRON HCL 4 MG/2ML IJ SOLN: 4.0000 mg | Freq: Three times a day (TID) | INTRAMUSCULAR | Status: DC | PRN

## 2017-12-29 MED ORDER — ONDANSETRON HCL 4 MG/2ML IJ SOLN
INTRAMUSCULAR | Status: DC | PRN
  Administered 2017-12-29: 4 mg via INTRAVENOUS

## 2017-12-29 MED ORDER — OXYCODONE HCL 5 MG PO TABS: 5.0000 mg | ORAL_TABLET | ORAL | Status: DC | PRN

## 2017-12-29 MED ORDER — OXYTOCIN 10 UNIT/ML IJ SOLN
INTRAMUSCULAR | Status: DC | PRN
  Administered 2017-12-29: 40 [IU] via INTRAMUSCULAR

## 2017-12-29 MED ORDER — ZOLPIDEM TARTRATE 5 MG PO TABS: 5.0000 mg | ORAL_TABLET | Freq: Every evening | ORAL | Status: DC | PRN

## 2017-12-29 MED ORDER — OXYCODONE HCL 5 MG PO TABS: 5.0000 mg | ORAL_TABLET | Freq: Once | ORAL | Status: DC | PRN

## 2017-12-29 MED ORDER — DIPHENHYDRAMINE HCL 25 MG PO CAPS: 25.0000 mg | ORAL_CAPSULE | Freq: Four times a day (QID) | ORAL | Status: DC | PRN

## 2017-12-29 MED ORDER — SIMETHICONE 80 MG PO CHEW
80.0000 mg | CHEWABLE_TABLET | Freq: Three times a day (TID) | ORAL | Status: DC
  Administered 2017-12-29 – 2017-12-31 (×5): 80 mg via ORAL
  Filled 2017-12-29 (×5): qty 1

## 2017-12-29 MED ORDER — SODIUM CHLORIDE 0.9 % IR SOLN
Status: DC | PRN
  Administered 2017-12-29: 1000 mL

## 2017-12-29 MED ORDER — COCONUT OIL OIL: 1.0000 "application " | TOPICAL_OIL | Status: DC | PRN

## 2017-12-29 MED ORDER — CEFAZOLIN SODIUM-DEXTROSE 2-4 GM/100ML-% IV SOLN
2.0000 g | INTRAVENOUS | Status: AC
  Administered 2017-12-29: 2 g via INTRAVENOUS
  Filled 2017-12-29: qty 100

## 2017-12-29 MED ORDER — WITCH HAZEL-GLYCERIN EX PADS: 1.0000 "application " | MEDICATED_PAD | CUTANEOUS | Status: DC | PRN

## 2017-12-29 MED ORDER — OXYCODONE HCL 5 MG/5ML PO SOLN: 5.0000 mg | Freq: Once | ORAL | Status: DC | PRN

## 2017-12-29 MED ORDER — HYDROMORPHONE HCL 1 MG/ML IJ SOLN: 0.2500 mg | INTRAMUSCULAR | Status: DC | PRN

## 2017-12-29 MED ORDER — PRENATAL MULTIVITAMIN CH
1.0000 | ORAL_TABLET | Freq: Every day | ORAL | Status: DC
  Administered 2017-12-30 – 2017-12-31 (×2): 1 via ORAL
  Filled 2017-12-29 (×3): qty 1

## 2017-12-29 MED ORDER — MORPHINE SULFATE (PF) 0.5 MG/ML IJ SOLN
INTRAMUSCULAR | Status: AC
  Filled 2017-12-29: qty 10

## 2017-12-29 MED ORDER — NALBUPHINE HCL 10 MG/ML IJ SOLN: 5.0000 mg | INTRAMUSCULAR | Status: DC | PRN

## 2017-12-29 MED ORDER — KETOROLAC TROMETHAMINE 30 MG/ML IJ SOLN: 30.0000 mg | Freq: Once | INTRAMUSCULAR | Status: DC | PRN

## 2017-12-29 MED ORDER — DIPHENHYDRAMINE HCL 50 MG/ML IJ SOLN: 12.5000 mg | INTRAMUSCULAR | Status: DC | PRN

## 2017-12-29 MED ORDER — SIMETHICONE 80 MG PO CHEW: 80.0000 mg | CHEWABLE_TABLET | ORAL | Status: DC | PRN

## 2017-12-29 MED ORDER — SIMETHICONE 80 MG PO CHEW
80.0000 mg | CHEWABLE_TABLET | ORAL | Status: DC
  Administered 2017-12-29 – 2017-12-30 (×2): 80 mg via ORAL
  Filled 2017-12-29 (×2): qty 1

## 2017-12-29 MED ORDER — MENTHOL 3 MG MT LOZG: 1.0000 | LOZENGE | OROMUCOSAL | Status: DC | PRN

## 2017-12-29 MED ORDER — ONDANSETRON HCL 4 MG/2ML IJ SOLN
INTRAMUSCULAR | Status: AC
  Filled 2017-12-29: qty 2

## 2017-12-29 MED ORDER — LACTATED RINGERS IV SOLN
INTRAVENOUS | Status: DC
  Administered 2017-12-29: 08:00:00 via INTRAVENOUS

## 2017-12-29 MED ORDER — OXYTOCIN 40 UNITS IN LACTATED RINGERS INFUSION - SIMPLE MED: 2.5000 [IU]/h | INTRAVENOUS | Status: AC

## 2017-12-29 MED ORDER — FENTANYL CITRATE (PF) 100 MCG/2ML IJ SOLN
INTRAMUSCULAR | Status: AC
  Filled 2017-12-29: qty 2

## 2017-12-29 MED ORDER — SOD CITRATE-CITRIC ACID 500-334 MG/5ML PO SOLN
30.0000 mL | ORAL | Status: AC
  Administered 2017-12-29: 30 mL via ORAL
  Filled 2017-12-29: qty 15

## 2017-12-29 SURGICAL SUPPLY — 37 items
BENZOIN TINCTURE PRP APPL 2/3 (GAUZE/BANDAGES/DRESSINGS) ×3 IMPLANT
CLAMP CORD UMBIL (MISCELLANEOUS) ×3 IMPLANT
CLOSURE WOUND 1/2 X4 (GAUZE/BANDAGES/DRESSINGS) ×1
CLOTH BEACON ORANGE TIMEOUT ST (SAFETY) ×3 IMPLANT
DERMABOND ADVANCED (GAUZE/BANDAGES/DRESSINGS)
DERMABOND ADVANCED .7 DNX12 (GAUZE/BANDAGES/DRESSINGS) IMPLANT
DRSG OPSITE POSTOP 4X10 (GAUZE/BANDAGES/DRESSINGS) ×3 IMPLANT
DURAPREP 26ML APPLICATOR (WOUND CARE) IMPLANT
ELECT REM PT RETURN 9FT ADLT (ELECTROSURGICAL) ×3
ELECTRODE REM PT RTRN 9FT ADLT (ELECTROSURGICAL) ×1 IMPLANT
EXTRACTOR VACUUM M CUP 4 TUBE (SUCTIONS) IMPLANT
EXTRACTOR VACUUM M CUP 4' TUBE (SUCTIONS)
GLOVE BIO SURGEON STRL SZ7.5 (GLOVE) ×3 IMPLANT
GLOVE BIOGEL PI IND STRL 7.0 (GLOVE) ×1 IMPLANT
GLOVE BIOGEL PI INDICATOR 7.0 (GLOVE) ×2
GOWN STRL REUS W/TWL LRG LVL3 (GOWN DISPOSABLE) ×6 IMPLANT
KIT ABG SYR 3ML LUER SLIP (SYRINGE) ×3 IMPLANT
NEEDLE HYPO 22GX1.5 SAFETY (NEEDLE) ×3 IMPLANT
NEEDLE HYPO 25X5/8 SAFETYGLIDE (NEEDLE) ×3 IMPLANT
NS IRRIG 1000ML POUR BTL (IV SOLUTION) ×3 IMPLANT
PACK C SECTION WH (CUSTOM PROCEDURE TRAY) ×3 IMPLANT
PAD ABD 7.5X8 STRL (GAUZE/BANDAGES/DRESSINGS) ×6 IMPLANT
PAD OB MATERNITY 4.3X12.25 (PERSONAL CARE ITEMS) ×3 IMPLANT
PENCIL SMOKE EVAC W/HOLSTER (ELECTROSURGICAL) IMPLANT
STRIP CLOSURE SKIN 1/2X4 (GAUZE/BANDAGES/DRESSINGS) ×2 IMPLANT
SUT CHROMIC 2 0 SH (SUTURE) ×3 IMPLANT
SUT MNCRL 0 VIOLET CTX 36 (SUTURE) ×4 IMPLANT
SUT MONOCRYL 0 CTX 36 (SUTURE) ×8
SUT PDS AB 0 CTX 60 (SUTURE) ×3 IMPLANT
SUT PLAIN 0 NONE (SUTURE) ×3 IMPLANT
SUT PLAIN 2 0 (SUTURE) ×2
SUT PLAIN 2 0 XLH (SUTURE) ×3 IMPLANT
SUT PLAIN ABS 2-0 CT1 27XMFL (SUTURE) ×1 IMPLANT
SUT VIC AB 4-0 KS 27 (SUTURE) ×3 IMPLANT
TAPE CLOTH SURG 4X10 WHT LF (GAUZE/BANDAGES/DRESSINGS) ×3 IMPLANT
TOWEL OR 17X24 6PK STRL BLUE (TOWEL DISPOSABLE) ×3 IMPLANT
TRAY FOLEY W/BAG SLVR 14FR LF (SET/KITS/TRAYS/PACK) ×3 IMPLANT

## 2017-12-29 NOTE — Lactation Note (Signed)
This note was copied from a baby's chart. Lactation Consultation Note  Patient Name: Brittany Gordon [id number]'JToday's Date: 12/29/2017   Initial visit at 7 hours of life. Mom is a P2 who said that she gave up too early with her 1st child. Infant is currently sleeping in bassinet & is getting ready to receive hearing screen.   Mom has my # to call when ready for consult.  Brittany Gordon, [facility] Gordon [facility] 12/29/2017, 4:08 PM

## 2017-12-29 NOTE — Lactation Note (Signed)
This note was copied from a baby's chart. Lactation Consultation Note  Patient Name: [name] ONGEX'BToday's Date: 12/29/2017    Mom has decided to pump & BO. Size 27 flanges are appropriate for her when pumping.    Brittany Gordon, [facility] Gordon [facility] 12/29/2017, 11:39 PM

## 2017-12-29 NOTE — Progress Notes (Signed)
No changes to H&P per patient history Reviewed with patient procedure-C/S and BTL All questions answered Patient states she understands and agrees

## 2017-12-29 NOTE — Anesthesia Postprocedure Evaluation (Signed)
Anesthesia Post Note  Patient: Brittany Gordon  Procedure(s) Performed: REPEAT CESAREAN SECTION WITH BILATERAL TUBAL LIGATION (N/A Abdomen)     Patient location during evaluation: Mother Baby Anesthesia Type: Spinal Level of consciousness: oriented and awake and alert Pain management: pain level controlled Vital Signs Assessment: post-procedure vital signs reviewed and stable Respiratory status: spontaneous breathing and respiratory function stable Cardiovascular status: blood pressure returned to baseline and stable Postop Assessment: no headache, no backache and no apparent nausea or vomiting Anesthetic complications: no    Last Vitals:  Vitals:   12/29/17 1150 12/29/17 1300  BP: (!) 95/57 (!) 104/56  Pulse: 60 64  Resp: 16 18  Temp: 36.4 C 36.7 C  SpO2: 98% 98%    Last Pain:  Vitals:   12/29/17 1300  TempSrc: Oral  PainSc:    Pain Goal:                 Junious SilkGILBERT,Mariano Doshi

## 2017-12-29 NOTE — Op Note (Signed)
NAME: Brittany Gordon, Brittany D. MEDICAL RECORD [id number] ACCOUNT [id number] DATE OF BIRTH:1985-09-07 FACILITY: [facility] LOCATION: [facility] PHYSICIAN:Earvin Blazier Jamal CollinE. Brookelyn Gaynor II, MD  OPERATIVE REPORT  DATE OF PROCEDURE:  12/29/2017  PREOPERATIVE DIAGNOSES: 1.  Desires repeat cesarean section. 2.  Desires permanent sterilization.  POSTOPERATIVE DIAGNOSES: 1.  Desires repeat cesarean section. 2.  Desires permanent sterilization.  PROCEDURE PERFORMED: 1.  Repeat low transverse cesarean section. 2.  Bilateral tubal ligation.  SURGEON:  Harold HedgeJames Asuncion Shibata, MD  ASSISTANT:  Mamie Leversracy Tucker, RNFA.  ESTIMATED BLOOD LOSS:  Per anesthesia record.  SPECIMENS:  Bilateral fallopian tube segments to pathology.  FINDINGS:  Viable female infant, Apgars birth and weight pending.  INDICATIONS:  This patient is a 32 year old patient with previous cesarean section.  She desires repeat.  She also desires permanent sterilization.  Potential risks and complications have been reviewed preoperatively including but not limited to  infection, organ damage, bleeding requiring transfusion of blood products with HIV and hepatitis acquisition, DVT, PE and pneumonia.  Permanent sterilization, alternative methods of contraception, failure rate, increased ectopic risk and permanence of  the procedure have been discussed.  The patient states she understands and agrees and consent is signed on the chart.  DESCRIPTION OF PROCEDURE:  The patient was taken to the operating room where she was identified.  Spinal anesthetic was placed per anesthesiology and she was placed in the dorsal supine position with a 15 degree left lateral wedge.  She was prepped  vaginally with Betadine.  Foley catheter was placed and prepped abdominally with ChloraPrep.  After a 3 minute drying time, she was draped in sterile fashion.  Timeout was undertaken.  After testing for adequate spinal anesthesia, skin was entered  through the Pfannenstiel scar.  Dissection  was carried out in layers to the peritoneum.  Peritoneum was incised and extended superiorly and inferiorly.  Vesicouterine peritoneum was taken down cephalad and laterally.  The bladder flap developed and the  bladder blade placed.  Uterus was incised in a low transverse manner and the uterine cavity was entered bluntly with a hemostat.  Clear fluid was noted.  Uterine incision was extended with the fingers.  Vertex was delivered without difficulty.  Baby was  delivered with good cry and tone.  After a 1 minute waiting period, the cord was clamped and cut and the baby was handed away to pediatrics team.  Placenta was manually delivered.  Uterus was exteriorized and the cavity is clean.  Uterus was closed in  two running locking imbricating layers of 0 Monocryl suture.  Left fallopian tube was identified from cornu to fimbria, grasped in its mid ampullary portion with a Babcock clamp.  The knuckle of fallopian tube was doubly ligated with two free ties of  plain suture and the intervening knuckle was sharply resected.  Cautery was used to assure hemostasis.  Similar procedure was carried out on the right.  Tubes appeared normal bilaterally.  Specimen is returned and a small bleeder in the center of the  incision is controlled with a 2-0 chromic suture.  Lavage was carried out.  Anterior peritoneum was closed in a running fashion with 0 Monocryl suture, which was also used to reapproximate the pyramidalis muscle in the midline.  The anterior rectus  fascia was closed in a running fashion with a 0 looped PDS.  Subcutaneous layer was closed with interrupted plain and the skin was closed in a subcuticular fashion with a 4-0 Vicryl on a Keith needle.  Benzoin, Steri-Strips, honeycomb dressing and  pressure dressing are applied.  All counts were correct and the patient was taken to recovery room in stable condition.  TN/NUANCE  D:12/29/2017 T:12/29/2017 JOB:[id number]

## 2017-12-29 NOTE — Anesthesia Procedure Notes (Signed)
Spinal  Patient location during procedure: OB Start time: 12/29/2017 8:06 AM End time: 12/29/2017 8:09 AM Staffing Anesthesiologist: Lowella CurbMiller, Terrianne Cavness Ray, MD Performed: anesthesiologist  Preanesthetic Checklist Completed: patient identified, surgical consent, pre-op evaluation, timeout performed, IV checked, risks and benefits discussed and monitors and equipment checked Spinal Block Patient position: sitting Prep: Betadine and site prepped and draped Patient monitoring: heart rate, cardiac monitor, continuous pulse ox and blood pressure Approach: midline Location: L3-4 Injection technique: single-shot Needle Needle type: Pencan  Needle gauge: 24 G Needle length: 10 cm Assessment Sensory level: T4

## 2017-12-29 NOTE — Anesthesia Preprocedure Evaluation (Signed)
Anesthesia Evaluation    Airway Mallampati: II  TM Distance: >3 FB Neck ROM: Full    Dental no notable dental hx.    Pulmonary asthma , former smoker,    Pulmonary exam normal breath sounds clear to auscultation       Cardiovascular Normal cardiovascular exam Rhythm:Regular Rate:Normal     Neuro/Psych  Headaches, Anxiety    GI/Hepatic   Endo/Other  diabetes, Gestational  Renal/GU      Musculoskeletal   Abdominal   Peds  Hematology   Anesthesia Other Findings   Reproductive/Obstetrics (+) Pregnancy                             Anesthesia Physical  Anesthesia Plan  ASA: III  Anesthesia Plan: Spinal   Post-op Pain Management:    Induction:   PONV Risk Score and Plan: 2 and Treatment may vary due to age or medical condition  Airway Management Planned: Natural Airway  Additional Equipment:   Intra-op Plan:   Post-operative Plan:   Informed Consent: I have reviewed the patients History and Physical, chart, labs and discussed the procedure including the risks, benefits and alternatives for the proposed anesthesia with the patient or authorized representative who has indicated his/her understanding and acceptance.     Plan Discussed with: CRNA, Anesthesiologist and Surgeon  Anesthesia Plan Comments:         Anesthesia Quick Evaluation                                   Anesthesia Evaluation  Patient identified by MRN, date of birth, ID band Patient awake    Reviewed: Allergy & Precautions, H&P , NPO status , Patient's Chart, lab work & pertinent test results, reviewed documented beta blocker date and time   History of Anesthesia Complications Negative for: history of anesthetic complications  Airway Mallampati: I TM Distance: >3 FB Neck ROM: full    Dental  (+) Teeth Intact   Pulmonary asthma (only uses inhaler with illness (last was January with bronchitis))  , Current Smoker,  breath sounds clear to auscultation        Cardiovascular negative cardio ROS  Rhythm:regular Rate:Normal     Neuro/Psych PSYCHIATRIC DISORDERS (anxiety) negative neurological ROS     GI/Hepatic negative GI ROS, Neg liver ROS,   Endo/Other  BMI 36.6  Renal/GU negative Renal ROS  negative genitourinary   Musculoskeletal   Abdominal   Peds  Hematology negative hematology ROS (+)   Anesthesia Other Findings   Reproductive/Obstetrics (+) Pregnancy                           Anesthesia Physical Anesthesia Plan  ASA: III  Anesthesia Plan: Epidural   Post-op Pain Management:    Induction:   Airway Management Planned:   Additional Equipment:   Intra-op Plan:   Post-operative Plan:   Informed Consent: I have reviewed the patients History and Physical, chart, labs and discussed the procedure including the risks, benefits and alternatives for the proposed anesthesia with the patient or authorized representative who has indicated his/her understanding and acceptance.     Plan Discussed with:   Anesthesia Plan Comments:         Anesthesia Quick Evaluation

## 2017-12-29 NOTE — Transfer of Care (Signed)
Immediate Anesthesia Transfer of Care Note  Patient: Brittany Gordon  Procedure(s) Performed: REPEAT CESAREAN SECTION WITH BILATERAL TUBAL LIGATION (N/A Abdomen)  Patient Location: PACU  Anesthesia Type:Spinal  Level of Consciousness: awake, alert , oriented and patient cooperative  Airway & Oxygen Therapy: Patient Spontanous Breathing  Post-op Assessment: Report given to RN and Post -op Vital signs reviewed and stable  Post vital signs: Reviewed and stable  Last Vitals:  Vitals Value Taken Time  BP    Temp    Pulse    Resp    SpO2      Last Pain:  Vitals:   12/29/17 0639  TempSrc: Oral         Complications: No apparent anesthesia complications

## 2017-12-29 NOTE — Anesthesia Postprocedure Evaluation (Signed)
Anesthesia Post Note  Patient: Brittany Gordon  Procedure(s) Performed: REPEAT CESAREAN SECTION WITH BILATERAL TUBAL LIGATION (N/A Abdomen)     Patient location during evaluation: PACU Anesthesia Type: Spinal Level of consciousness: oriented and awake and alert Pain management: pain level controlled Vital Signs Assessment: post-procedure vital signs reviewed and stable Respiratory status: spontaneous breathing and respiratory function stable Cardiovascular status: blood pressure returned to baseline and stable Postop Assessment: no headache, no backache and no apparent nausea or vomiting Anesthetic complications: no    Last Vitals:  Vitals:   12/29/17 1015 12/29/17 1037  BP: 94/61 (!) 102/56  Pulse: 66 60  Resp: 13 16  Temp:  36.4 C  SpO2: 97% 96%    Last Pain:  Vitals:   12/29/17 1037  TempSrc: Oral   Pain Goal:                 Lowella CurbWarren Ray Fritzi Scripter

## 2017-12-29 NOTE — Brief Op Note (Signed)
12/29/2017  9:34 AM  PATIENT:  Brittany Gordon  32 y.o. female  PRE-OPERATIVE DIAGNOSIS:  previous X 1, undesired fertility  POST-OPERATIVE DIAGNOSIS:  previous X 1, undesired fertility  PROCEDURE:  Procedure(s) with comments: REPEAT CESAREAN SECTION WITH BILATERAL TUBAL LIGATION (N/A) - Repeat edc 12/28/17 NKDA Tracey RNFA  SURGEON:  Surgeon(s) and Role:    * Harold Hedgeomblin, Gloris Shiroma, MD - Primary  PHYSICIAN ASSISTANT:   ASSISTANTS: none  Mamie Leversracy Tucker ANESTHESIA:   spinal  EBL:  629 mL   BLOOD ADMINISTERED:none  DRAINS: Urinary Catheter (Foley)   LOCAL MEDICATIONS USED:  NONE  SPECIMEN:  No Specimen and Source of Specimen:  bilateral fallopian tube segments  DISPOSITION OF SPECIMEN:  PATHOLOGY  COUNTS:  YES  TOURNIQUET:  * No tourniquets in log *  DICTATION: .Other Dictation: Dictation Number [id number]  PLAN OF CARE: Admit to inpatient   PATIENT DISPOSITION:  PACU - hemodynamically stable.   Delay start of Pharmacological VTE agent (>24hrs) due to surgical blood loss or risk of bleeding: not applicable

## 2017-12-29 NOTE — Lactation Note (Addendum)
This note was copied from a baby's chart. Lactation Consultation Note  Patient Name: Brittany Eulis Manlylecia Whitacre [id number]'XToday's Date: 12/29/2017 Reason for consult: Initial assessment(continued)  Mom's nipples are not flat, but they are short-shafted & her breast tissue is not compressible to get a deep latch. A hand pump was attempted to further evert nipple, but without success.   A nipple shield was applied, but infant did not latch well until supplementation with formula was begun at the breast (infant was becoming upset; hand expression was done with Mom, but little was yielded & she found hand expression to be a bit uncomfortable). This infant would be a good candidate for using a 5 Fr & syringe at the breast.   Mom lactated for less than 1 week with her 1st child; she reports having been able to pump 4oz/session, but had thought that wasn't enough, so she quit.   Mom reports minor breast changes with this pregnancy.   Mom has my # to call when ready for me to show her how to use DEBP.   Mom's inhaler, Dulera, is an L3.   [name], [name] 12/29/2017, 5:17 PM

## 2017-12-29 NOTE — Addendum Note (Signed)
Addendum  created 12/29/17 1411 by Junious SilkGilbert, Saira Kramme, CRNA   Sign clinical note

## 2017-12-30 LAB — CBC
HCT: 23.2 % — ABNORMAL LOW (ref 36.0–46.0)
Hemoglobin: 7.7 g/dL — ABNORMAL LOW (ref 12.0–15.0)
MCH: 28.2 pg (ref 26.0–34.0)
MCHC: 33.2 g/dL (ref 30.0–36.0)
MCV: 85 fL (ref 78.0–100.0)
PLATELETS: 191 10*3/uL (ref 150–400)
RBC: 2.73 MIL/uL — ABNORMAL LOW (ref 3.87–5.11)
RDW: 14.8 % (ref 11.5–15.5)
WBC: 11.6 10*3/uL — ABNORMAL HIGH (ref 4.0–10.5)

## 2017-12-30 LAB — BIRTH TISSUE RECOVERY COLLECTION (PLACENTA DONATION)

## 2017-12-30 MED ORDER — LACTATED RINGERS IV BOLUS
500.0000 mL | Freq: Once | INTRAVENOUS | Status: AC
  Administered 2017-12-30: 500 mL via INTRAVENOUS

## 2017-12-30 MED ORDER — PNEUMOCOCCAL VAC POLYVALENT 25 MCG/0.5ML IJ INJ
0.5000 mL | INJECTION | INTRAMUSCULAR | Status: DC
  Filled 2017-12-30: qty 0.5

## 2017-12-30 MED ORDER — SODIUM CHLORIDE 0.9 % IV SOLN
510.0000 mg | Freq: Once | INTRAVENOUS | Status: AC
  Administered 2017-12-30: 510 mg via INTRAVENOUS
  Filled 2017-12-30: qty 17

## 2017-12-30 NOTE — Progress Notes (Signed)
CSW received consult for MOB due to history of anxiety and depression. CSW met with MOB to discuss history. MOB reports that she has been asymptomatic for ten years. MOB reports that she does not need medication. CSW educated MOB on baby blues period versus postpartum depression. CSW encouraged MOB to reach out if questions or needs arise.  Madilyn Fireman, MSW, [place] Social Worker [facility] [phone]

## 2017-12-30 NOTE — Progress Notes (Signed)
MD called and message left for continuing low urine output (100 mL over 4 hours - which is consistent/slightly improved based on what was documented all night shift). Iron given per order and IVF continued per order. Foley catheter remains in place, per conversation between night RN and MD this am (would like foley in place until at least this afternoon).  This RN will continue to monitor patient, encourage patient to drink water and ambulate.

## 2017-12-30 NOTE — Progress Notes (Addendum)
Rn called Dr. Vincente PoliGrewal concerning mom's decreased urine output of 100mls over 6 hours. Dr Vincente PoliGrewal ordered a 500ml lactated ringers bolus.

## 2017-12-30 NOTE — Progress Notes (Signed)
Rn called Dr Vincente PoliGrewal concerning patient's decreased output. Dr. Jori MollGrewal  Ordered a 500cc lactated Ringer bolus.

## 2017-12-30 NOTE — Progress Notes (Signed)
Patient feeling good. No complaints.  BP (!) 105/56   Pulse 74   Temp 98.1 F (36.7 C) (Oral)   Resp 18   Ht 5\' 5"  (1.651 m)   Wt 106.5 kg (234 lb 11.2 oz)   SpO2 97%   BMI 39.06 kg/m  Results for orders placed or performed during the hospital encounter of 12/29/17 (from the past 24 hour(s))  CBC     Status: Abnormal   Collection Time: 12/30/17  5:22 AM  Result Value Ref Range   WBC 11.6 (H) 4.0 - 10.5 K/uL   RBC 2.73 (L) 3.87 - 5.11 MIL/uL   Hemoglobin 7.7 (L) 12.0 - 15.0 g/dL   HCT 30.823.2 (L) 65.736.0 - 84.646.0 %   MCV 85.0 78.0 - 100.0 fL   MCH 28.2 26.0 - 34.0 pg   MCHC 33.2 30.0 - 36.0 g/dL   RDW 96.214.8 95.211.5 - 84.115.5 %   Platelets 191 150 - 400 K/uL   Urine output low overnight - now after 2 boluses and lasix it has improved. Incision clean and dry and intact  POD # 1  Doing well Routine care feraheme today

## 2017-12-31 ENCOUNTER — Encounter (HOSPITAL_COMMUNITY): Payer: Self-pay | Admitting: *Deleted

## 2017-12-31 LAB — CBC
HEMATOCRIT: 22 % — AB (ref 36.0–46.0)
Hemoglobin: 7.1 g/dL — ABNORMAL LOW (ref 12.0–15.0)
MCH: 27.8 pg (ref 26.0–34.0)
MCHC: 32.3 g/dL (ref 30.0–36.0)
MCV: 86.3 fL (ref 78.0–100.0)
PLATELETS: 195 10*3/uL (ref 150–400)
RBC: 2.55 MIL/uL — AB (ref 3.87–5.11)
RDW: 14.8 % (ref 11.5–15.5)
WBC: 10.2 10*3/uL (ref 4.0–10.5)

## 2017-12-31 MED ORDER — IBUPROFEN 600 MG PO TABS: 600.0000 mg | ORAL_TABLET | Freq: Four times a day (QID) | ORAL | 0 refills | Status: AC

## 2017-12-31 NOTE — Lactation Note (Signed)
This note was copied from a baby's chart. Lactation Consultation Note  Patient Name: Girl Eulis Manlylecia Garry ZOXWR'UToday's Date: 12/31/2017   Visited with P2 Mom of term infant at 4150 hrs old.  Mom had started supplementing with SNS early in the first 8 hrs, using a nipple shield.  Mom later decided to bottle feed.  Weight loss at 4.8% currently.    Mom states that baby is opening widely and latching deeply to breast.  Mom denies any discomfort with latch.  Baby getting formula after baby breastfeeds, until her volume comes in.  Mom has pump set up, and states she isn't getting much more than drops.  Reassured Mom and FOB that this was normal.  Mom to continue double pumping, and breast massage and hand expression also.   Encouraged STS as much as possible, 8-12 feedings per 24 hrs.  Mom has a lansinoh DEBP at home.  Talked about Symphony pumps being the strongest pump, and better to establish a full milk supply.  Mom aware that we rent them in gift shop.  Talked about importance of OP lactation follow-up.  Mom very interested in coming back to see Lactation.  Mom needs support and reassurance about normal breastfeeding. Referral sent for appointment.  Engorgement prevention and treatment discussed.  Mom aware of OP lactation services.  Encouraged to call prn.  Judee ClaraSmith, Katheen Aslin E 12/31/2017, 10:32 AM

## 2017-12-31 NOTE — Discharge Summary (Signed)
Obstetric Discharge Summary Reason for Admission: cesarean section Prenatal Procedures: none Intrapartum Procedures: spontaneous vaginal delivery and cesarean: low cervical, transverse Postpartum Procedures: feraheme Complications-Operative and Postpartum: none Hemoglobin  Date Value Ref Range Status  12/31/2017 7.1 (Gordon) 12.0 - 15.0 g/dL Final   HCT  Date Value Ref Range Status  12/31/2017 22.0 (Gordon) 36.0 - 46.0 % Final    Physical Exam:  General: alert, cooperative and appears stated age 40Lochia: appropriate Uterine Fundus: firm Incision: healing well, no significant drainage, no dehiscence DVT Evaluation: No evidence of DVT seen on physical exam.  Discharge Diagnoses: Term Pregnancy-delivered  Discharge Information: Date: 12/31/2017 Activity: pelvic rest Diet: routine Medications: Ibuprofen Condition: improved Instructions: refer to practice specific booklet Discharge to: home   Newborn Data: Live born female  Birth Weight: 7 lb 12 oz (3515 g) APGAR: 8, 9  Newborn Delivery   Birth date/time:  12/29/2017 08:33:00 Delivery type:  C-Section, Low Transverse Trial of labor:  No C-section categorization:  Repeat     Home with mother.  Brittany Gordon 12/31/2017, 8:12 AM

## 2018-02-14 DIAGNOSIS — Z1389 Encounter for screening for other disorder: Secondary | ICD-10-CM | POA: Diagnosis not present

## 2018-02-28 DIAGNOSIS — J4540 Moderate persistent asthma, uncomplicated: Secondary | ICD-10-CM | POA: Diagnosis not present

## 2018-02-28 DIAGNOSIS — R69 Illness, unspecified: Secondary | ICD-10-CM | POA: Diagnosis not present

## 2018-03-14 DIAGNOSIS — R69 Illness, unspecified: Secondary | ICD-10-CM | POA: Diagnosis not present

## 2018-03-16 DIAGNOSIS — J0100 Acute maxillary sinusitis, unspecified: Secondary | ICD-10-CM | POA: Diagnosis not present

## 2018-06-08 DIAGNOSIS — R69 Illness, unspecified: Secondary | ICD-10-CM | POA: Diagnosis not present

## 2018-06-26 DIAGNOSIS — L0390 Cellulitis, unspecified: Secondary | ICD-10-CM | POA: Diagnosis not present

## 2019-01-24 IMAGING — CR DG CHEST 2V
2 series · 2 of 2 positions shown · non-contrast
Comparison: 08/30/2012

CLINICAL DATA: Shortness of breath and wheezing

EXAM:
CHEST - 2 VIEW

[chest pa]
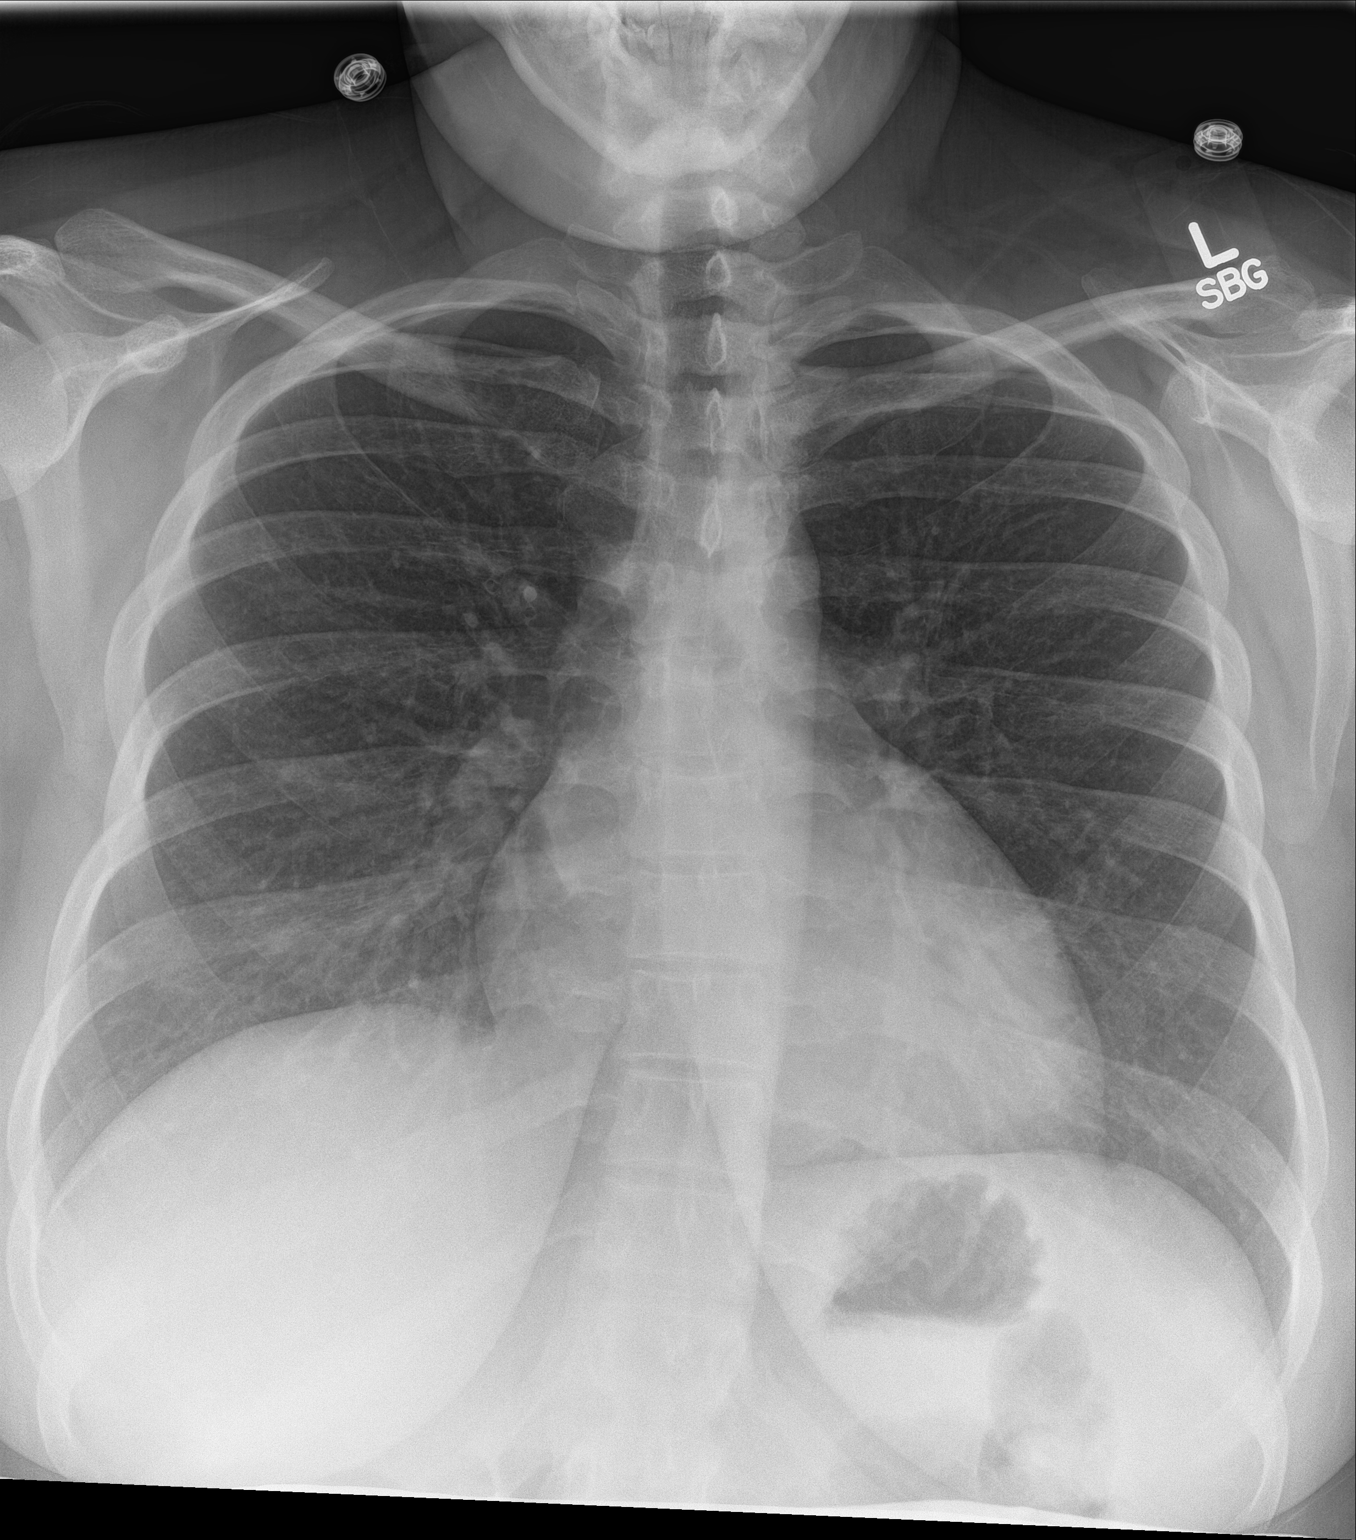

[chest lat]
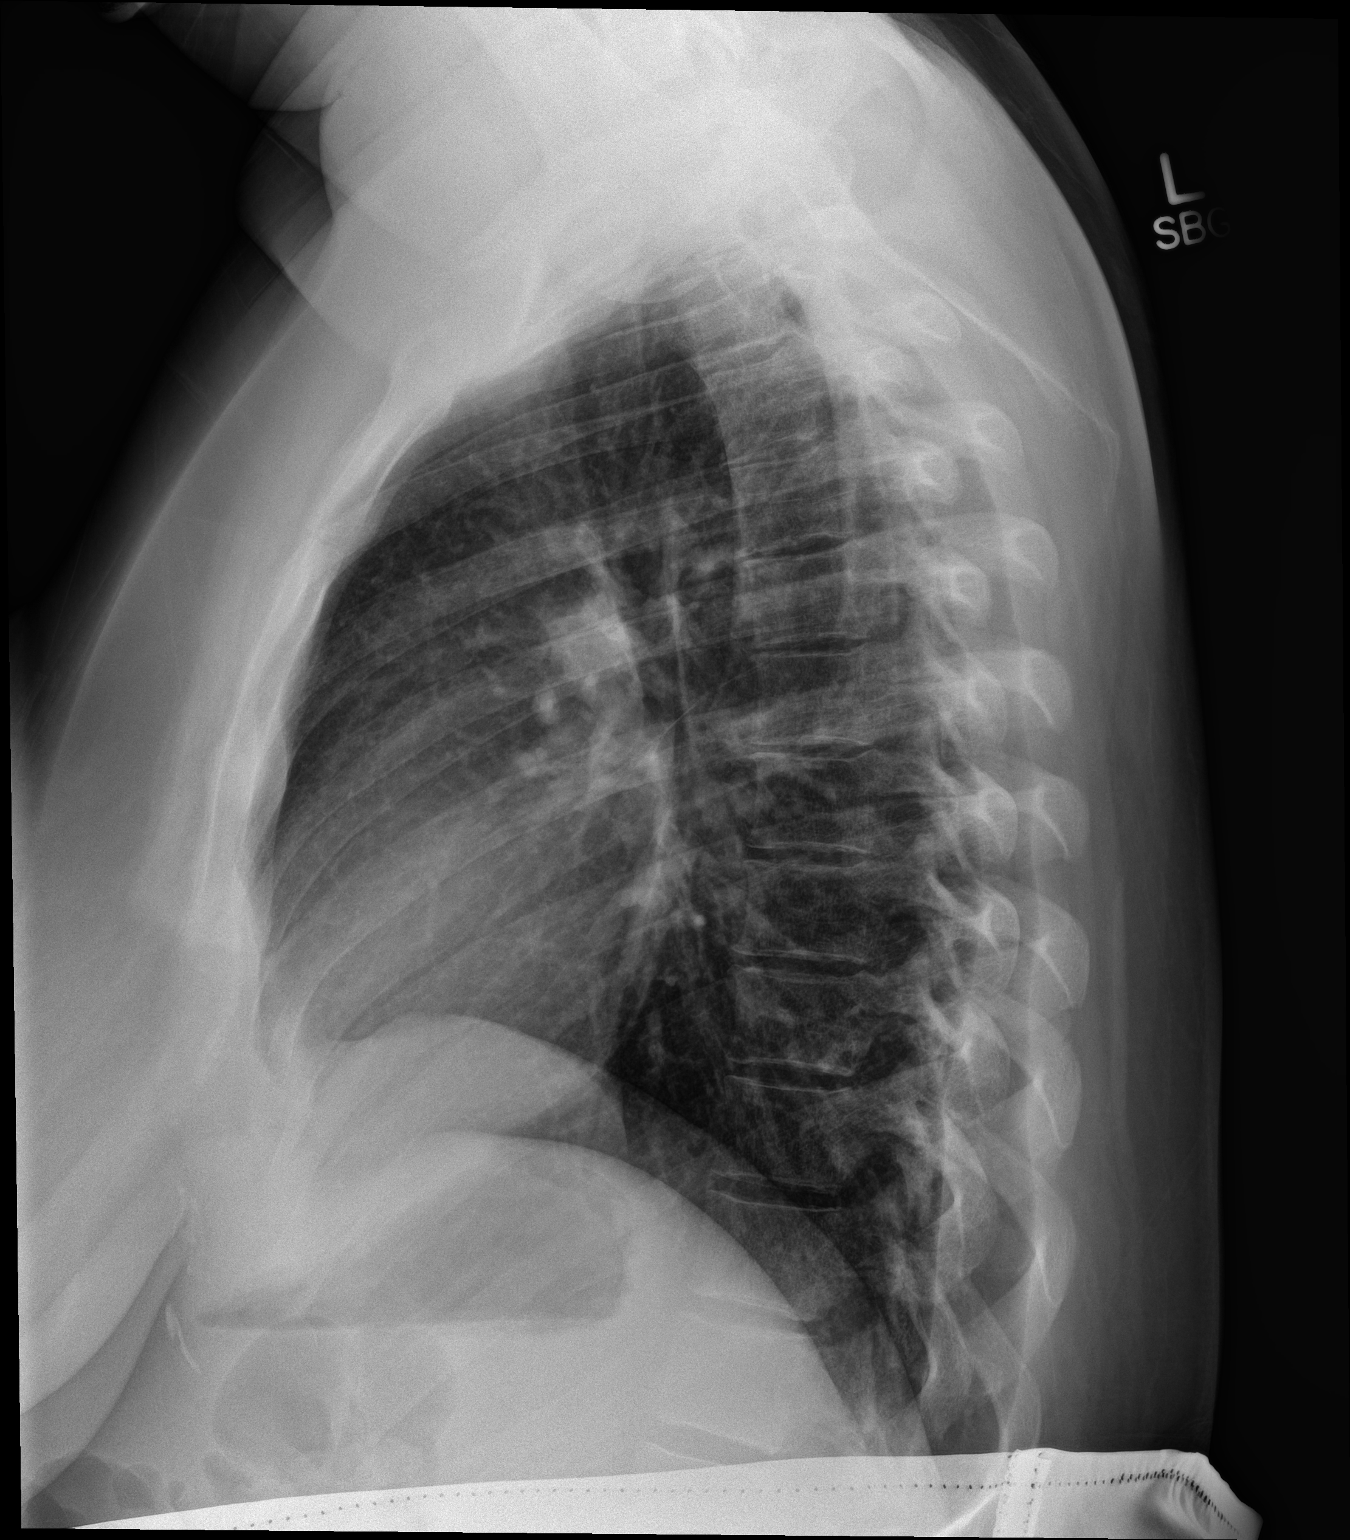

[2 of 2 positions shown; findings below may reference images not displayed]

FINDINGS: The heart size and mediastinal contours are within normal limits.
Both lungs are clear. The visualized skeletal structures are
unremarkable.
IMPRESSION: No active cardiopulmonary disease.

## 2019-02-18 ENCOUNTER — Other Ambulatory Visit: Payer: Self-pay

## 2019-02-18 DIAGNOSIS — Z20822 Contact with and (suspected) exposure to covid-19: Secondary | ICD-10-CM

## 2019-02-19 LAB — NOVEL CORONAVIRUS, NAA: SARS-CoV-2, NAA: NOT DETECTED

## 2019-04-22 ENCOUNTER — Other Ambulatory Visit: Payer: Self-pay

## 2019-04-22 DIAGNOSIS — Z20822 Contact with and (suspected) exposure to covid-19: Secondary | ICD-10-CM

## 2019-04-23 LAB — NOVEL CORONAVIRUS, NAA: SARS-CoV-2, NAA: NOT DETECTED

## 2019-08-25 ENCOUNTER — Ambulatory Visit: Payer: Self-pay | Attending: Internal Medicine

## 2019-08-25 DIAGNOSIS — Z23 Encounter for immunization: Secondary | ICD-10-CM | POA: Insufficient documentation

## 2019-08-25 NOTE — Progress Notes (Signed)
   Covid-19 Vaccination Clinic  Name:  Brittany Gordon    MRN: [id number] DOB: 04-Feb-1986  08/25/2019  [name] was observed post Covid-19 immunization for 15 minutes without incidence. She was provided with Vaccine Information Sheet and instruction to access the V-Safe system.   [name] was instructed to call 911 with any severe reactions post vaccine: Marland Kitchen Difficulty breathing  . Swelling of your face and throat  . A fast heartbeat  . A bad rash all over your body  . Dizziness and weakness    Immunizations Administered    Name Date Dose VIS Date Route   Pfizer COVID-19 Vaccine 08/25/2019  2:20 PM 0.3 mL 06/07/2019 Intramuscular   Manufacturer: [employer], [employer]   Lot: [id number]   NDC: [address]-2

## 2019-09-17 ENCOUNTER — Ambulatory Visit: Payer: Self-pay | Attending: Internal Medicine

## 2019-09-17 DIAGNOSIS — Z23 Encounter for immunization: Secondary | ICD-10-CM

## 2019-09-17 NOTE — Progress Notes (Signed)
   Covid-19 Vaccination Clinic  Name:  ADRIENNA KARIS    MRN: [id number] DOB: [birth year]  09/17/2019  Ms. Rothenberger was observed post Covid-19 immunization for 15 minutes without incident. She was provided with Vaccine Information Sheet and instruction to access the V-Safe system.   Ms. Kamm was instructed to call 911 with any severe reactions post vaccine: Marland Kitchen Difficulty breathing  . Swelling of face and throat  . A fast heartbeat  . A bad rash all over body  . Dizziness and weakness   Immunizations Administered    Name Date Dose VIS Date Route   Pfizer COVID-19 Vaccine 09/17/2019  9:40 AM 0.3 mL 06/07/2019 Intramuscular   Manufacturer: [employer], [employer]   Lot: [id number]   NDC: [address]-3

## 2020-07-01 ENCOUNTER — Other Ambulatory Visit: Payer: Self-pay

## 2020-07-01 ENCOUNTER — Ambulatory Visit
Admission: EM | Admit: 2020-07-01 | Discharge: 2020-07-01 | Disposition: A | Discharge status: Home / Self Care | Payer: Managed Care, Other (non HMO) | Attending: Family Medicine | Admitting: Family Medicine

## 2020-07-01 DIAGNOSIS — Z1152 Encounter for screening for COVID-19: Secondary | ICD-10-CM

## 2020-07-01 DIAGNOSIS — B349 Viral infection, unspecified: Secondary | ICD-10-CM

## 2020-07-01 NOTE — ED Triage Notes (Signed)
Pt presents with c/o cough and some sob for past couple days, positive covid exposure, h/o asthma

## 2020-07-01 NOTE — Discharge Instructions (Signed)
Nothing concerning on exam.  Medicines as needed. Covid test pending

## 2020-07-02 NOTE — ED Provider Notes (Signed)
Renaldo Fiddler    CSN: [id number] Arrival date & time: 07/01/20  1109      History   Chief Complaint Chief Complaint  Patient presents with  . Cough  . Generalized Body Aches    HPI Brittany Gordon is a 35 y.o. female.   Patient is a 35 year old female who presents today with cough, mild shortness of breath for the past couple days.  Positive Covid exposure.  History of asthma.  No fevers, chills, body aches or night sweats.     Past Medical History:  Diagnosis Date  . Anxiety    no meds during pregnancy  . Asthma    as child  . Headache   . Hx of chlamydia infection   . Hx of varicella   . Trichomonas contact, treated     Patient Active Problem List   Diagnosis Date Noted  . H/O cesarean section 12/29/2017  . Moderate asthma with acute exacerbation   . Shortness of breath   . Hypoxemia   . Asthma exacerbation 10/19/2017    Past Surgical History:  Procedure Laterality Date  . CESAREAN SECTION N/A 12/01/2012   Procedure: primary cesarean section with delivery of baby girl at 35. Apgars 9/9.;  Surgeon: Freddrick March. Tenny Craw, MD;  Location: [facility];  Service: Obstetrics;  Laterality: N/A;  . CESAREAN SECTION WITH BILATERAL TUBAL LIGATION N/A 12/29/2017   Procedure: REPEAT CESAREAN SECTION WITH BILATERAL TUBAL LIGATION;  Surgeon: Harold Hedge, MD;  Location: [facility]IRTH[facility] SU[facility];  Service: Obstetrics;  Laterality: N/A;  Repeat edc 12/28/17 NKDA Tracey RNFA  . CHOLECYSTECTOMY      OB History    Gravida  2   Para  1   Term  1   Preterm      AB      Living  1     SAB      IAB      Ectopic      Multiple      Live Births  1            Home Medications    Prior to Admission medications   Medication Sig Start Date End Date Taking? Authorizing Provider  acetaminophen (TYLENOL) 500 MG tablet Take 1,000 mg by mouth every 6 (six) hours as needed for moderate pain or headache.     [provider]  albuterol (PROVENTIL HFA;VENTOLIN HFA)  108 (90 Base) MCG/ACT inhaler Inhale 2 puffs into the lungs every 4 (four) hours as needed for wheezing or shortness of breath.     [provider]  budesonide-formoterol (SYMBICORT) 160-4.5 MCG/ACT inhaler Inhale 2 puffs into the lungs 2 (two) times daily. 11/01/17   Bevelyn Ngo, NP  ibuprofen (ADVIL,MOTRIN) 600 MG tablet Take 1 tablet (600 mg total) by mouth every 6 (six) hours. 12/31/17   Marcelle Overlie, MD  Prenatal Vit-Fe Fumarate-FA (PRENATAL MULTIVITAMIN) TABS tablet Take 1 tablet by mouth daily.     [provider]    Family History Family History  Problem Relation Age of Onset  . Hypertension Mother   . Heart attack Maternal Grandfather   . Diabetes Maternal Grandfather   . Diabetes Paternal Grandmother   . Hypertension Paternal Grandfather   . Stroke Paternal Grandfather   . Heart disease Paternal Grandfather   . Colon cancer Paternal Grandfather   . Diabetes Father   . Hypertension Maternal Aunt   . Rheum arthritis Paternal Aunt     Social History Social History  Tobacco Use  . Smoking status: Former Smoker    Packs/day: 1.00    Years: 10.00    Pack years: 10.00    Types: Cigarettes    Quit date: 11/14/2016    Years since quitting: 3.6  . Smokeless tobacco: Never Used  Substance Use Topics  . Alcohol use: No  . Drug use: No     Allergies   Latex   Review of Systems Review of Systems   Physical Exam Triage Vital Signs ED Triage Vitals  Enc Vitals Group     BP 07/01/20 1252 117/77     Pulse Rate 07/01/20 1252 90     Resp 07/01/20 1252 18     Temp 07/01/20 1252 98.1 F (36.7 C)     Temp src --      SpO2 07/01/20 1252 98 %     Weight --      Height --      Head Circumference --      Peak Flow --      Pain Score 07/01/20 1253 0     Pain Loc --      Pain Edu? --      Excl. in [place]? --    No data found.  Updated Vital Signs BP 117/77   Pulse 90   Temp 98.1 F (36.7 C)   Resp 18   LMP  (LMP Unknown)   SpO2 98%   Visual  Acuity Right Eye Distance:   Left Eye Distance:   Bilateral Distance:    Right Eye Near:   Left Eye Near:    Bilateral Near:     Physical Exam Vitals and nursing note reviewed.  Constitutional:      General: She is not in acute distress.    Appearance: Normal appearance. She is not ill-appearing, toxic-appearing or diaphoretic.  HENT:     Head: Normocephalic.     Nose: Nose normal.     Mouth/Throat:     Pharynx: Oropharynx is clear.  Eyes:     Conjunctiva/sclera: Conjunctivae normal.  Cardiovascular:     Rate and Rhythm: Normal rate and regular rhythm.  Pulmonary:     Effort: Pulmonary effort is normal.     Breath sounds: Normal breath sounds.  Musculoskeletal:        General: Normal range of motion.     Cervical back: Normal range of motion.  Skin:    General: Skin is warm and dry.     Findings: No rash.  Neurological:     Mental Status: She is alert.  Psychiatric:        Mood and Affect: Mood normal.      UC Treatments / Results  Labs (all labs ordered are listed, but only abnormal results are displayed) Labs Reviewed  NOVEL CORONAVIRUS, NAA    EKG   Radiology No results found.  Procedures Procedures (including critical care time)  Medications Ordered in UC Medications - No data to display  Initial Impression / Assessment and Plan / UC Course  I have reviewed the triage vital signs and the nursing notes.  Pertinent labs & imaging results that were available during my care of the patient were reviewed by me and considered in my medical decision making (see chart for details).     Viral illness Nothing concerning on exam Covid test pending. Over-the-counter medicines as needed. Follow up as needed for continued or worsening symptoms  Final Clinical Impressions(s) / UC Diagnoses   Final diagnoses:  Viral illness     Discharge Instructions     Nothing concerning on exam.  Medicines as needed. Covid test pending    ED Prescriptions     None     PDMP not reviewed this encounter.   Janace Aris, NP 07/02/20 [phone]

## 2020-07-03 LAB — NOVEL CORONAVIRUS, NAA: SARS-CoV-2, NAA: DETECTED — AB

## 2020-07-03 LAB — SARS-COV-2, NAA 2 DAY TAT

## 2022-11-26 ENCOUNTER — Ambulatory Visit
Admission: EM | Admit: 2022-11-26 | Discharge: 2022-11-26 | Disposition: A | Discharge status: Home / Self Care | Payer: Medicaid Other

## 2022-11-26 ENCOUNTER — Emergency Department (HOSPITAL_COMMUNITY): Payer: Self-pay

## 2022-11-26 ENCOUNTER — Emergency Department (HOSPITAL_COMMUNITY)
Admission: EM | Admit: 2022-11-26 | Discharge: 2022-11-26 | Disposition: A | Discharge status: Home / Self Care | Payer: Self-pay | Attending: Emergency Medicine | Admitting: Emergency Medicine

## 2022-11-26 ENCOUNTER — Other Ambulatory Visit: Payer: Self-pay

## 2022-11-26 DIAGNOSIS — Z91040 Latex allergy status: Secondary | ICD-10-CM | POA: Insufficient documentation

## 2022-11-26 DIAGNOSIS — K047 Periapical abscess without sinus: Secondary | ICD-10-CM | POA: Insufficient documentation

## 2022-11-26 DIAGNOSIS — R1012 Left upper quadrant pain: Secondary | ICD-10-CM | POA: Insufficient documentation

## 2022-11-26 DIAGNOSIS — D72829 Elevated white blood cell count, unspecified: Secondary | ICD-10-CM | POA: Insufficient documentation

## 2022-11-26 DIAGNOSIS — R112 Nausea with vomiting, unspecified: Secondary | ICD-10-CM | POA: Insufficient documentation

## 2022-11-26 DIAGNOSIS — R1013 Epigastric pain: Secondary | ICD-10-CM | POA: Insufficient documentation

## 2022-11-26 LAB — COMPREHENSIVE METABOLIC PANEL
ALT: 32 U/L (ref 0–44)
AST: 24 U/L (ref 15–41)
Albumin: 3.9 g/dL (ref 3.5–5.0)
Alkaline Phosphatase: 59 U/L (ref 38–126)
Anion gap: 10 (ref 5–15)
BUN: 9 mg/dL (ref 6–20)
CO2: 23 mmol/L (ref 22–32)
Calcium: 8.7 mg/dL — ABNORMAL LOW (ref 8.9–10.3)
Chloride: 107 mmol/L (ref 98–111)
Creatinine, Ser: 0.83 mg/dL (ref 0.44–1.00)
GFR, Estimated: >60 mL/min (ref >60)
Glucose, Bld: 89 mg/dL (ref 70–99)
Potassium: 4 mmol/L (ref 3.5–5.1)
Sodium: 140 mmol/L (ref 135–145)
Total Bilirubin: 0.7 mg/dL (ref 0.3–1.2)
Total Protein: 7.1 g/dL (ref 6.5–8.1)

## 2022-11-26 LAB — CBC
HCT: 40.9 % (ref 36.0–46.0)
Hemoglobin: 13.5 g/dL (ref 12.0–15.0)
MCH: 28.7 pg (ref 26.0–34.0)
MCHC: 33 g/dL (ref 30.0–36.0)
MCV: 86.8 fL (ref 80.0–100.0)
Platelets: 309 10*3/uL (ref 150–400)
RBC: 4.71 MIL/uL (ref 3.87–5.11)
RDW: 13.1 % (ref 11.5–15.5)
WBC: 14.3 10*3/uL — ABNORMAL HIGH (ref 4.0–10.5)
nRBC: 0 % (ref 0.0–0.2)

## 2022-11-26 LAB — LIPASE, BLOOD: Lipase: 24 U/L (ref 11–51)

## 2022-11-26 LAB — RAPID URINE DRUG SCREEN, HOSP PERFORMED
Amphetamines: NOT DETECTED
Barbiturates: NOT DETECTED
Benzodiazepines: NOT DETECTED
Cocaine: NOT DETECTED
Opiates: NOT DETECTED
Tetrahydrocannabinol: NOT DETECTED

## 2022-11-26 LAB — SALICYLATE LEVEL: Salicylate Lvl: <7 mg/dL — ABNORMAL LOW (ref 7.0–30.0)

## 2022-11-26 LAB — I-STAT BETA HCG BLOOD, ED (MC, WL, AP ONLY): I-stat hCG, quantitative: <5 m[IU]/mL (ref <5)

## 2022-11-26 LAB — ACETAMINOPHEN LEVEL: Acetaminophen (Tylenol), Serum: 17 ug/mL (ref 10–30)

## 2022-11-26 LAB — ETHANOL: Alcohol, Ethyl (B): <10 mg/dL (ref <10)

## 2022-11-26 MED ORDER — OXYCODONE HCL 5 MG PO TABS: 5.0000 mg | ORAL_TABLET | Freq: Three times a day (TID) | ORAL | 0 refills | Status: DC | PRN

## 2022-11-26 MED ORDER — IOHEXOL 350 MG/ML SOLN
75.0000 mL | Freq: Once | INTRAVENOUS | Status: AC | PRN
  Administered 2022-11-26: 75 mL via INTRAVENOUS

## 2022-11-26 MED ORDER — FAMOTIDINE IN NACL 20-0.9 MG/50ML-% IV SOLN
20.0000 mg | Freq: Once | INTRAVENOUS | Status: AC
  Administered 2022-11-26: 20 mg via INTRAVENOUS
  Filled 2022-11-26: qty 50

## 2022-11-26 MED ORDER — FAMOTIDINE 20 MG PO TABS: 20.0000 mg | ORAL_TABLET | Freq: Two times a day (BID) | ORAL | 0 refills | Status: DC

## 2022-11-26 MED ORDER — ACETAMINOPHEN 325 MG PO TABS: 650.0000 mg | ORAL_TABLET | Freq: Four times a day (QID) | ORAL | 0 refills | Status: DC | PRN

## 2022-11-26 MED ORDER — ONDANSETRON HCL 4 MG/2ML IJ SOLN
4.0000 mg | Freq: Once | INTRAMUSCULAR | Status: AC
Start: 2022-11-26 — End: 2022-11-26
  Administered 2022-11-26: 4 mg via INTRAVENOUS
  Filled 2022-11-26: qty 2

## 2022-11-26 MED ORDER — ONDANSETRON 4 MG PO TBDP: 4.0000 mg | ORAL_TABLET | Freq: Three times a day (TID) | ORAL | 0 refills | Status: AC | PRN

## 2022-11-26 MED ORDER — DEXAMETHASONE SODIUM PHOSPHATE 4 MG/ML IJ SOLN
4.0000 mg | Freq: Once | INTRAMUSCULAR | Status: AC
  Administered 2022-11-26: 4 mg via INTRAVENOUS
  Filled 2022-11-26: qty 1

## 2022-11-26 MED ORDER — MORPHINE SULFATE (PF) 4 MG/ML IV SOLN
4.0000 mg | Freq: Once | INTRAVENOUS | Status: AC
  Administered 2022-11-26: 4 mg via INTRAVENOUS
  Filled 2022-11-26: qty 1

## 2022-11-26 MED ORDER — PENICILLIN V POTASSIUM 500 MG PO TABS: 500.0000 mg | ORAL_TABLET | Freq: Four times a day (QID) | ORAL | 0 refills | Status: AC

## 2022-11-26 MED ORDER — AMPICILLIN-SULBACTAM SODIUM 3 (2-1) G IJ SOLR
3.0000 g | Freq: Once | INTRAMUSCULAR | Status: AC
  Administered 2022-11-26: 3 g via INTRAVENOUS
  Filled 2022-11-26: qty 8

## 2022-11-26 NOTE — ED Notes (Signed)
Drink provided, pt able to swallow small sips

## 2022-11-26 NOTE — ED Triage Notes (Signed)
PT states R sided jaw pain that woke her up out of her sleep yesterday at 4 am.  Has been taking 800 of ibuprofen and 1300 of tylenol every 3 hours since yesterday (even through the night), until pt started vomiting and couldn't keep anything down.

## 2022-11-26 NOTE — ED Provider Notes (Signed)
[place] EMERGENCY DEPARTMENT AT [facility] Provider Note   CSN: [id number] Arrival date & time: 11/26/22  1506     History  Chief Complaint  Patient presents with   Jaw Pain   Nausea    Brittany Gordon is a 37 y.o. female presenting to ED complaining of pain in her mouth, nausea vomiting.  Patient reports that she has pain in her right upper posterior molar, feels it is a cracked tooth, and was taking ibuprofen and Tylenol every 3 hours overnight.  She began to have abdominal pain and then persistent vomiting this morning.  She cannot keep down any food or fluids, she says she vomits immediately afterwards.  She has cramping lower abdominal pain.  Denies constipation or diarrhea, regular bowel movement.  Denies preceding history of reflux.  She does not take daily NSAIDs.  Reports a history of cholecystectomy and C-section.  HPI     Home Medications Prior to Admission medications   Medication Sig Start Date End Date Taking? Authorizing Provider  acetaminophen (TYLENOL) 325 MG tablet Take 2 tablets (650 mg total) by mouth every 6 (six) hours as needed for up to 30 doses for moderate pain or mild pain. 11/26/22  Yes Terald Sleeper, MD  famotidine (PEPCID) 20 MG tablet Take 1 tablet (20 mg total) by mouth 2 (two) times daily. 11/26/22 12/26/22 Yes Edd Reppert, Kermit Balo, MD  ondansetron (ZOFRAN-ODT) 4 MG disintegrating tablet Take 1 tablet (4 mg total) by mouth every 8 (eight) hours as needed for up to 12 days for nausea or vomiting. 11/26/22 12/08/22 Yes Charlii Yost, Kermit Balo, MD  oxyCODONE (ROXICODONE) 5 MG immediate release tablet Take 1 tablet (5 mg total) by mouth every 8 (eight) hours as needed for up to 10 doses for severe pain. 11/26/22  Yes Isella Slatten, Kermit Balo, MD  penicillin v potassium (VEETID) 500 MG tablet Take 1 tablet (500 mg total) by mouth 4 (four) times daily for 7 days. 11/26/22 12/03/22 Yes Lalania Haseman, Kermit Balo, MD  acetaminophen (TYLENOL) 500 MG tablet Take 1,000 mg by mouth every  6 (six) hours as needed for moderate pain or headache.     [provider]  albuterol (PROVENTIL HFA;VENTOLIN HFA) 108 (90 Base) MCG/ACT inhaler Inhale 2 puffs into the lungs every 4 (four) hours as needed for wheezing or shortness of breath.     [provider]  budesonide-formoterol (SYMBICORT) 160-4.5 MCG/ACT inhaler Inhale 2 puffs into the lungs 2 (two) times daily. 11/01/17   Bevelyn Ngo, NP  ibuprofen (ADVIL,MOTRIN) 600 MG tablet Take 1 tablet (600 mg total) by mouth every 6 (six) hours. 12/31/17   Marcelle Overlie, MD  Prenatal Vit-Fe Fumarate-FA (PRENATAL MULTIVITAMIN) TABS tablet Take 1 tablet by mouth daily.     [provider]      Allergies    Latex    Review of Systems   Review of Systems  Physical Exam Updated Vital Signs BP (!) 96/58   Pulse (!) 53   Temp 97.9 F (36.6 C) (Oral)   Resp 14   Ht 5\' 5"  (1.651 m)   Wt 99.8 kg   SpO2 99%   BMI 36.61 kg/m  Physical Exam Constitutional:      General: She is not in acute distress. HENT:     Head: Normocephalic and atraumatic.     Comments: Right upper posterior molar (wisdom tooth) fractured, no evident.  Dental abscess No tenderness of the parotid gland or elevation of the tongue or  brawny edema Eyes:     Conjunctiva/sclera: Conjunctivae normal.     Pupils: Pupils are equal, round, and reactive to light.  Cardiovascular:     Rate and Rhythm: Normal rate and regular rhythm.  Pulmonary:     Effort: Pulmonary effort is normal. No respiratory distress.  Abdominal:     General: There is no distension.     Tenderness: There is abdominal tenderness in the epigastric area and left upper quadrant. There is no guarding. Negative signs include McBurney's sign.  Skin:    General: Skin is warm and dry.  Neurological:     General: No focal deficit present.     Mental Status: She is alert. Mental status is at baseline.  Psychiatric:        Mood and Affect: Mood normal.        Behavior: Behavior  normal.     ED Results / Procedures / Treatments   Labs (all labs ordered are listed, but only abnormal results are displayed) Labs Reviewed  COMPREHENSIVE METABOLIC PANEL - Abnormal; Notable for the following components:      Result Value   Calcium 8.7 (*)    All other components within normal limits  SALICYLATE LEVEL - Abnormal; Notable for the following components:   Salicylate Lvl <7.0 (*)    All other components within normal limits  CBC - Abnormal; Notable for the following components:   WBC 14.3 (*)    All other components within normal limits  ETHANOL  ACETAMINOPHEN LEVEL  RAPID URINE DRUG SCREEN, HOSP PERFORMED  LIPASE, BLOOD  I-STAT BETA HCG BLOOD, ED ([facility], [facility], [facility] ONLY)    EKG EKG Interpretation  Date/Time:  Saturday November 26 2022 15:25:47 EDT Ventricular Rate:  61 PR Interval:  220 QRS Duration: 86 QT Interval:  420 QTC Calculation: 422 R Axis:   100 Text Interpretation: Sinus rhythm Rightward axis Borderline ECG No previous ECGs available Confirmed by Alvester Chou [phone]) on 11/26/2022 6:29:18 PM  Radiology CT ABDOMEN PELVIS W CONTRAST  Result Date: 11/26/2022 CLINICAL DATA:  Nausea, vomiting, diffuse abdominal tender to palpation. EXAM: CT ABDOMEN AND PELVIS WITH CONTRAST TECHNIQUE: Multidetector CT imaging of the abdomen and pelvis was performed using the standard protocol following bolus administration of intravenous contrast. RADIATION DOSE REDUCTION: This exam was performed according to the departmental dose-optimization program which includes automated exposure control, adjustment of the mA and/or kV according to patient size and/or use of iterative reconstruction technique. CONTRAST:  75mL OMNIPAQUE IOHEXOL 350 MG/ML SOLN COMPARISON:  None Available. FINDINGS: Lower chest: 4 mm subpleural right lower lobe nodule series 4, image 8. 4 mm left lower lobe subpleural nodule series 4, image 16. No acute basilar airspace disease or pleural effusion. Hepatobiliary:  Diffusely decreased hepatic density typical of steatosis. No focal liver abnormality. Clips in the gallbladder fossa postcholecystectomy. No biliary dilatation. Pancreas: There is equivocal faint fat stranding about the pancreatic head, for example series 3, image 37. No ductal dilatation or evidence of pancreatic mass. Spleen: Normal in size without focal abnormality. Adrenals/Urinary Tract: Normal adrenal glands. No hydronephrosis, renal calculi or suspicious renal lesion. Unremarkable urinary bladder. Stomach/Bowel: Occasional left colonic diverticula but no diverticulitis or colonic inflammation. Small volume of colonic stool. No small bowel distension, inflammation or obstruction. Normal appendix visualized. Normal appearance of the stomach. Vascular/Lymphatic: Normal vascular structures. No abdominopelvic adenopathy. Reproductive: Uterus and bilateral adnexa are unremarkable. Other: No ascites, free air or abdominopelvic collection. Small fat containing umbilical hernia. Musculoskeletal: There are no acute  or suspicious osseous abnormalities. IMPRESSION: 1. Equivocal faint fat stranding about the pancreatic head, can be seen with pancreatitis. Recommend correlation with pancreatic enzymes. 2. Hepatic steatosis. 3. Minimal colonic diverticulosis without diverticulitis. No colitis. 4. Small fat containing umbilical hernia. 5. Small pulmonary nodules in the lung bases measuring 4 mm. Per Fleischner Society Guidelines, no routine follow-up imaging is recommended. These guidelines do not apply to immunocompromised patients and patients with cancer. Follow up in patients with significant comorbidities as clinically warranted. For lung cancer screening, adhere to Lung-RADS guidelines. Reference: Radiology. 2017; 284(1):228-43. Electronically Signed   By: Narda Rutherford M.D.   On: 11/26/2022 20:08    Procedures Procedures    Medications Ordered in ED Medications  famotidine (PEPCID) IVPB 20 mg premix (0 mg  Intravenous Stopped 11/26/22 2018)  ondansetron (ZOFRAN) injection 4 mg (4 mg Intravenous Given 11/26/22 1920)  morphine (PF) 4 MG/ML injection 4 mg (4 mg Intravenous Given 11/26/22 1921)  dexamethasone (DECADRON) injection 4 mg (4 mg Intravenous Given 11/26/22 1920)  Ampicillin-Sulbactam (UNASYN) 3 g in sodium chloride 0.9 % 100 mL IVPB (0 g Intravenous Stopped 11/26/22 2101)  iohexol (OMNIPAQUE) 350 MG/ML injection 75 mL (75 mLs Intravenous Contrast Given 11/26/22 1958)    ED Course/ Medical Decision Making/ A&P Clinical Course as of 11/26/22 2332  Sat Nov 26, 2022  2147 Patient reassessed, feeling somewhat better, pain now a 5 out of 10.  We are attempting a p.o. challenge. [MT]  2225 Patient is feeling much better and tolerated p.o. challenge.  She is wanting to go home and this is reasonable at this point. [MT]    Clinical Course User Index [MT] Yolonda Purtle, Kermit Balo, MD                             Medical Decision Making Amount and/or Complexity of Data Reviewed Labs: ordered. Radiology: ordered.  Risk OTC drugs. Prescription drug management.   This patient presents to the ED with concern for abdominal pain, nausea, vomiting, dental pain. This involves an extensive number of treatment options, and is a complaint that carries with it a high risk of complications and morbidity.  The differential diagnosis includes peridontal infection w/ cracked molar - no evidence of Ludwig's angina, epiglottitis, deep space tracking infection.  Nausea and vomiting may be related to overuse of NSAIDs, however, with her diffuse abdominal tenderness and her leukocytosis I do think it is reasonable to pursue CT imaging to evaluate for potential colitis or inflammatory bowel disease.   I ordered and personally interpreted labs.  The pertinent results include: CMP unremarkable, white blood cell count elevated 14.3.  No other emergent findings  I ordered imaging studies including CT abdomen pelvis with contrast I  independently visualized and interpreted imaging which showed no emergent findings I agree with the radiologist interpretation  Per my interpretation the patient's ECG shows normal sinus are without acute ischemic findings  I ordered medication including IV Unasyn for dental infection, IV morphine, Pepcid, Decadron, and Zofran for nausea, abdominal pain, dental pain and swelling  I have reviewed the patients home medicines and have made adjustments as needed  Test Considered: Low suspicion for ACS, PE, meningitis.  No indication for chest imaging at this time.  After the interventions noted above, I reevaluated the patient and found that they have: improved  Dispostion:  After consideration of the diagnostic results and the patients response to treatment, I feel that the patent would  benefit from dental infection and gastritis, perhaps very mild pancreatic inflammation on CT (but normal lipase), which I discussed with the patient, but I do not see indication for hospitalization as she is tolerating p.o. fluids at this time.         Final Clinical Impression(s) / ED Diagnoses Final diagnoses:  Dental infection  Nausea and vomiting, unspecified vomiting type    Rx / DC Orders ED Discharge Orders          Ordered    oxyCODONE (ROXICODONE) 5 MG immediate release tablet  Every 8 hours PRN        11/26/22 2227    ondansetron (ZOFRAN-ODT) 4 MG disintegrating tablet  Every 8 hours PRN        11/26/22 2227    famotidine (PEPCID) 20 MG tablet  2 times daily        11/26/22 2227    penicillin v potassium (VEETID) 500 MG tablet  4 times daily        11/26/22 2227    acetaminophen (TYLENOL) 325 MG tablet  Every 6 hours PRN        11/26/22 2227              Terald Sleeper, MD 11/26/22 2333

## 2022-11-26 NOTE — Discharge Instructions (Addendum)
You will need to follow-up with the dentist for your cracked tooth and dental infection.  Please continue drinking plenty of water to keep yourself hydrated at home.  Please return to the ER if you have persistent or worsening vomiting, abdominal pain, difficulty swallowing, shortness of breath, chest pressure, or any other emergency concerns.

## 2023-11-27 ENCOUNTER — Ambulatory Visit
Admission: EM | Admit: 2023-11-27 | Discharge: 2023-11-27 | Disposition: A | Discharge status: Home / Self Care | Attending: Emergency Medicine | Admitting: Emergency Medicine

## 2023-11-27 ENCOUNTER — Encounter: Payer: Self-pay | Admitting: Emergency Medicine

## 2023-11-27 DIAGNOSIS — A084 Viral intestinal infection, unspecified: Secondary | ICD-10-CM | POA: Diagnosis not present

## 2023-11-27 MED ORDER — DICYCLOMINE HCL 20 MG PO TABS: 20.0000 mg | ORAL_TABLET | Freq: Two times a day (BID) | ORAL | 0 refills | Status: DC

## 2023-11-27 MED ORDER — LOPERAMIDE HCL 2 MG PO CAPS: 2.0000 mg | ORAL_CAPSULE | Freq: Four times a day (QID) | ORAL | 0 refills | Status: DC | PRN

## 2023-11-27 NOTE — Discharge Instructions (Addendum)
 Your symptoms are most likely caused by a virus, it will work its way out your system over the next few days   May use dicyclomine twice daily to help slow down moment with the intestines which may help with diarrhea and pain   You can use Imodium every 6 hours to help with diarrhea, and be mindful over use of this medication may cause opposite effect constipation  You can use over-the-counter ibuprofen  or Tylenol , which ever you have at home, to help manage fevers  Continue to promote hydration throughout the day by using electrolyte replacement solution such as Gatorade, body armor, Pedialyte, which ever you have at home  Try eating bland foods such as bread, rice, toast, fruit which are easier on the stomach to digest, avoid foods that are overly spicy, overly seasoned or greasy

## 2023-11-27 NOTE — ED Triage Notes (Signed)
 Complains of dizziness that started yesterday. Abdominal and diarrhea that started at 4 am this morning. Pain 7/10. Has not taking anything for symptoms.

## 2023-11-27 NOTE — ED Provider Notes (Signed)
 [name]    CSN: [id number] Arrival date & time: 11/27/23  1356      History   Chief Complaint Chief Complaint  Patient presents with   Abdominal Pain   Dizziness    Brittany Gordon is a 38 y.o. female.   Patient presents for evaluation of dizziness and lightheadedness beginning 1 day ago [place] amusement park.  Overnight began to experience centralized abdominal pain described as sharp and stabbing and watery explosive diarrhea.  Unable to tolerate food, tolerating small amounts of liquid.  No known sick contact, denies dietary changes, no other members of household affected.  Has not attempted treatment of symptoms.  Past Medical History:  Diagnosis Date   Anxiety    no meds during pregnancy   Asthma    as child   Headache    Hx of chlamydia infection    Hx of varicella    Trichomonas contact, treated     Patient Active Problem List   Diagnosis Date Noted   H/O cesarean section 12/29/2017   Moderate asthma with acute exacerbation    Shortness of breath    Hypoxemia    Asthma exacerbation 10/19/2017    Past Surgical History:  Procedure Laterality Date   CESAREAN SECTION N/A 12/01/2012   Procedure: primary cesarean section with delivery of baby girl at 70. Apgars 9/9.;  Surgeon: Elridge Haller. Avanell Bob, MD;  Location: [facility];  Service: Obstetrics;  Laterality: N/A;   CESAREAN SECTION WITH BILATERAL TUBAL LIGATION N/A 12/29/2017   Procedure: REPEAT CESAREAN SECTION WITH BILATERAL TUBAL LIGATION;  Surgeon: Thora Flint, MD;  Location: [facility];  Service: Obstetrics;  Laterality: N/A;  Repeat edc 12/28/17 NKDA Tracey RNFA   CHOLECYSTECTOMY      OB History     Gravida  2   Para  1   Term  1   Preterm      AB      Living  1      SAB      IAB      Ectopic      Multiple      Live Births  1            Home Medications    Prior to Admission medications   Medication Sig Start Date End Date Taking? Authorizing Provider   dicyclomine (BENTYL) 20 MG tablet Take 1 tablet (20 mg total) by mouth 2 (two) times daily. 11/27/23  Yes Arshan Jabs, Maybelle Spatz, NP  loperamide (IMODIUM) 2 MG capsule Take 1 capsule (2 mg total) by mouth 4 (four) times daily as needed for diarrhea or loose stools. 11/27/23  Yes Wakisha Alberts, Maybelle Spatz, NP  acetaminophen  (TYLENOL ) 325 MG tablet Take 2 tablets (650 mg total) by mouth every 6 (six) hours as needed for up to 30 doses for moderate pain or mild pain. 11/26/22   Arvilla Birmingham, MD  acetaminophen  (TYLENOL ) 500 MG tablet Take 1,000 mg by mouth every 6 (six) hours as needed for moderate pain or headache.     [provider]  albuterol  (PROVENTIL  HFA;VENTOLIN  HFA) 108 (90 Base) MCG/ACT inhaler Inhale 2 puffs into the lungs every 4 (four) hours as needed for wheezing or shortness of breath.     [provider]  budesonide -formoterol  (SYMBICORT ) 160-4.5 MCG/ACT inhaler Inhale 2 puffs into the lungs 2 (two) times daily. 11/01/17   Raejean Bullock, NP  famotidine  (PEPCID ) 20 MG tablet Take 1 tablet (20 mg total) by mouth  2 (two) times daily. 11/26/22 12/26/22  Arvilla Birmingham, MD  ibuprofen  (ADVIL ,MOTRIN ) 600 MG tablet Take 1 tablet (600 mg total) by mouth every 6 (six) hours. 12/31/17   Thurman Flores, MD  oxyCODONE  (ROXICODONE ) 5 MG immediate release tablet Take 1 tablet (5 mg total) by mouth every 8 (eight) hours as needed for up to 10 doses for severe pain. 11/26/22   Arvilla Birmingham, MD  Prenatal Vit-Fe Fumarate-FA (PRENATAL MULTIVITAMIN) TABS tablet Take 1 tablet by mouth daily.     [provider]    Family History Family History  Problem Relation Age of Onset   Hypertension Mother    Heart attack Maternal Grandfather    Diabetes Maternal Grandfather    Diabetes Paternal Grandmother    Hypertension Paternal Grandfather    Stroke Paternal Grandfather    Heart disease Paternal Grandfather    Colon cancer Paternal Grandfather    Diabetes Father    Hypertension Maternal Aunt     Rheum arthritis Paternal Aunt     Social History Social History   Tobacco Use   Smoking status: Former    Current packs/day: 0.00    Average packs/day: 1 pack/day for 10.0 years (10.0 ttl pk-yrs)    Types: Cigarettes    Start date: 11/15/2006    Quit date: 11/14/2016    Years since quitting: 7.0   Smokeless tobacco: Never  Substance Use Topics   Alcohol use: No   Drug use: No     Allergies   Latex   Review of Systems Review of Systems  Gastrointestinal:  Positive for abdominal pain.  Neurological:  Positive for dizziness.     Physical Exam Triage Vital Signs ED Triage Vitals  Encounter Vitals Group     BP 11/27/23 1415 124/84     Systolic BP Percentile --      Diastolic BP Percentile --      Pulse Rate 11/27/23 1415 98     Resp 11/27/23 1415 18     Temp 11/27/23 1415 97.9 F (36.6 C)     Temp Source 11/27/23 1415 Oral     SpO2 11/27/23 1415 95 %     Weight --      Height --      Head Circumference --      Peak Flow --      Pain Score 11/27/23 1416 7     Pain Loc --      Pain Education --      Exclude from Growth Chart --    No data found.  Updated Vital Signs BP 124/84 (BP Location: Left Arm)   Pulse 98   Temp 97.9 F (36.6 C) (Oral)   Resp 18   SpO2 95%   Visual Acuity Right Eye Distance:   Left Eye Distance:   Bilateral Distance:    Right Eye Near:   Left Eye Near:    Bilateral Near:     Physical Exam Constitutional:      Appearance: Normal appearance.  Eyes:     Extraocular Movements: Extraocular movements intact.  Pulmonary:     Effort: Pulmonary effort is normal.  Abdominal:     General: Abdomen is flat. Bowel sounds are normal.     Palpations: Abdomen is soft.     Tenderness: There is abdominal tenderness in the right lower quadrant, epigastric area and left lower quadrant.  Neurological:     Mental Status: She is alert and oriented to person, place, and time. Mental  status is at baseline.      UC Treatments / Results   Labs (all labs ordered are listed, but only abnormal results are displayed) Labs Reviewed - No data to display  EKG   Radiology No results found.  Procedures Procedures (including critical care time)  Medications Ordered in UC Medications - No data to display  Initial Impression / Assessment and Plan / UC Course  I have reviewed the triage vital signs and the nursing notes.  Pertinent labs & imaging results that were available during my care of the patient were reviewed by me and considered in my medical decision making (see chart for details).  Viral Gastroenteritis  Vital signs stable, patient while visibly uncomfortable is in no signs of distress nontoxic-appearing, tenderness noted to the epigastric and intestinal regions, expected finding based on symptomology, not guarding and able to sit comfortably within exam room, prescribed dicyclomine and Imodium, recommended supportive care and increase fluid intake until symptoms resolve, may follow-up with urgent care as needed Final Clinical Impressions(s) / UC Diagnoses   Final diagnoses:  Viral gastroenteritis   Discharge Instructions      Your symptoms are most likely caused by a virus, it will work its way out your system over the next few days   May use dicyclomine twice daily to help slow down moment with the intestines which may help with diarrhea and pain   You can use Imodium every 6 hours to help with diarrhea, and be mindful over use of this medication may cause opposite effect constipation  You can use over-the-counter ibuprofen  or Tylenol , which ever you have at home, to help manage fevers  Continue to promote hydration throughout the day by using electrolyte replacement solution such as Gatorade, body armor, Pedialyte, which ever you have at home  Try eating bland foods such as bread, rice, toast, fruit which are easier on the stomach to digest, avoid foods that are overly spicy, overly seasoned or greasy    ED Prescriptions     Medication Sig Dispense Auth. Provider   dicyclomine (BENTYL) 20 MG tablet Take 1 tablet (20 mg total) by mouth 2 (two) times daily. 20 tablet Merrissa Giacobbe R, NP   loperamide (IMODIUM) 2 MG capsule Take 1 capsule (2 mg total) by mouth 4 (four) times daily as needed for diarrhea or loose stools. 12 capsule Maty Zeisler R, NP      PDMP not reviewed this encounter.   Reena Canning, NP 11/27/23 1445

## 2023-12-22 ENCOUNTER — Encounter (HOSPITAL_BASED_OUTPATIENT_CLINIC_OR_DEPARTMENT_OTHER): Payer: Self-pay | Admitting: Emergency Medicine

## 2023-12-22 ENCOUNTER — Other Ambulatory Visit: Payer: Self-pay

## 2023-12-22 DIAGNOSIS — N764 Abscess of vulva: Secondary | ICD-10-CM | POA: Insufficient documentation

## 2023-12-22 DIAGNOSIS — Z91040 Latex allergy status: Secondary | ICD-10-CM | POA: Diagnosis not present

## 2023-12-22 NOTE — ED Triage Notes (Signed)
 Pt coming in from home. Pt states she has a bump on her vulva that feels like an abscess. Pt states she noticed it yesterday and used warm compresses, but states today it has gotten larger and more painful

## 2023-12-23 ENCOUNTER — Emergency Department (HOSPITAL_BASED_OUTPATIENT_CLINIC_OR_DEPARTMENT_OTHER)
Admission: EM | Admit: 2023-12-23 | Discharge: 2023-12-23 | Disposition: A | Discharge status: Home / Self Care | Attending: Emergency Medicine | Admitting: Emergency Medicine

## 2023-12-23 DIAGNOSIS — N764 Abscess of vulva: Secondary | ICD-10-CM | POA: Diagnosis not present

## 2023-12-23 MED ORDER — CEPHALEXIN 250 MG PO CAPS
500.0000 mg | ORAL_CAPSULE | Freq: Once | ORAL | Status: AC
  Administered 2023-12-23: 500 mg via ORAL
  Filled 2023-12-23: qty 2

## 2023-12-23 MED ORDER — ACETAMINOPHEN 325 MG PO TABS
650.0000 mg | ORAL_TABLET | Freq: Once | ORAL | Status: AC
  Administered 2023-12-23: 650 mg via ORAL
  Filled 2023-12-23: qty 2

## 2023-12-23 MED ORDER — CEPHALEXIN 500 MG PO CAPS: 500.0000 mg | ORAL_CAPSULE | Freq: Four times a day (QID) | ORAL | 0 refills | Status: DC

## 2023-12-23 MED ORDER — DOXYCYCLINE HYCLATE 100 MG PO TABS
100.0000 mg | ORAL_TABLET | Freq: Once | ORAL | Status: AC
  Administered 2023-12-23: 100 mg via ORAL
  Filled 2023-12-23: qty 1

## 2023-12-23 MED ORDER — DOXYCYCLINE HYCLATE 100 MG PO CAPS: 100.0000 mg | ORAL_CAPSULE | Freq: Two times a day (BID) | ORAL | 0 refills | Status: DC

## 2023-12-23 MED ORDER — IBUPROFEN 400 MG PO TABS
600.0000 mg | ORAL_TABLET | Freq: Once | ORAL | Status: AC
  Administered 2023-12-23: 600 mg via ORAL
  Filled 2023-12-23: qty 1

## 2023-12-23 NOTE — ED Provider Notes (Signed)
 [place] EMERGENCY DEPARTMENT AT [facility] Provider Note   CSN: [id number] Arrival date & time: 12/22/23  2345     Patient presents with: Abscess   Brittany Gordon is a 38 y.o. female.   Patient presents to the emergency department with concern over possible abscess.  Patient with tender swelling of the right labia since yesterday.  She has been using warm compresses without improvement.  No drainage.       Prior to Admission medications   Medication Sig Start Date End Date Taking? Authorizing Provider  cephALEXin (KEFLEX) 500 MG capsule Take 1 capsule (500 mg total) by mouth 4 (four) times daily. 12/23/23  Yes Raelynn Corron, Lonni PARAS, MD  doxycycline (VIBRAMYCIN) 100 MG capsule Take 1 capsule (100 mg total) by mouth 2 (two) times daily. 12/23/23  Yes Declyn Offield, Lonni PARAS, MD  acetaminophen  (TYLENOL ) 325 MG tablet Take 2 tablets (650 mg total) by mouth every 6 (six) hours as needed for up to 30 doses for moderate pain or mild pain. 11/26/22   Cottie Donnice PARAS, MD  acetaminophen  (TYLENOL ) 500 MG tablet Take 1,000 mg by mouth every 6 (six) hours as needed for moderate pain or headache.     [provider]  albuterol  (PROVENTIL  HFA;VENTOLIN  HFA) 108 (90 Base) MCG/ACT inhaler Inhale 2 puffs into the lungs every 4 (four) hours as needed for wheezing or shortness of breath.     [provider]  budesonide -formoterol  (SYMBICORT ) 160-4.5 MCG/ACT inhaler Inhale 2 puffs into the lungs 2 (two) times daily. 11/01/17   Ruthell Lauraine FALCON, NP  dicyclomine  (BENTYL ) 20 MG tablet Take 1 tablet (20 mg total) by mouth 2 (two) times daily. 11/27/23   White, Shelba SAUNDERS, NP  famotidine  (PEPCID ) 20 MG tablet Take 1 tablet (20 mg total) by mouth 2 (two) times daily. 11/26/22 12/26/22  Cottie Donnice PARAS, MD  ibuprofen  (ADVIL ,MOTRIN ) 600 MG tablet Take 1 tablet (600 mg total) by mouth every 6 (six) hours. 12/31/17   Mat Browning, MD  loperamide  (IMODIUM ) 2 MG capsule Take 1 capsule (2 mg  total) by mouth 4 (four) times daily as needed for diarrhea or loose stools. 11/27/23   White, Shelba SAUNDERS, NP  oxyCODONE  (ROXICODONE ) 5 MG immediate release tablet Take 1 tablet (5 mg total) by mouth every 8 (eight) hours as needed for up to 10 doses for severe pain. 11/26/22   Cottie Donnice PARAS, MD  Prenatal Vit-Fe Fumarate-FA (PRENATAL MULTIVITAMIN) TABS tablet Take 1 tablet by mouth daily.     [provider]    Allergies: Latex    Review of Systems  Updated Vital Signs BP (!) 127/94 (BP Location: Right Arm)   Pulse 91   Temp 98.1 F (36.7 C) (Oral)   Resp 17   Ht 5' 6 (1.676 m)   Wt 102.1 kg   SpO2 100%   BMI 36.32 kg/m   Physical Exam Vitals and nursing note reviewed. Exam conducted with a chaperone present.  Constitutional:      Appearance: Normal appearance.  HENT:     Head: Normocephalic and atraumatic.  Genitourinary:    Labia:        Right: Tenderness present. No rash.        Left: No rash or tenderness.     Skin:    Findings: No rash.   Neurological:     Mental Status: She is alert.     Sensory: Sensation is intact.     Motor: Motor function is intact.     (  all labs ordered are listed, but only abnormal results are displayed) Labs Reviewed - No data to display  EKG: None  Radiology: No results found.   Procedures   Medications Ordered in the ED  doxycycline (VIBRA-TABS) tablet 100 mg (has no administration in time range)  cephALEXin (KEFLEX) capsule 500 mg (has no administration in time range)  acetaminophen  (TYLENOL ) tablet 650 mg (650 mg Oral Given 12/23/23 0004)  ibuprofen  (ADVIL ) tablet 600 mg (600 mg Oral Given 12/23/23 0004)                                    Medical Decision Making Risk OTC drugs.   Examination reveals some firmness and slight swelling of the right mid portion of the labial region without overlying erythema, induration.  No fluctuance.  Suspect very early abscess but no drainable fluid collection present by  inspection at this time.  Will initiate antibiotics.  Patient will perform sitz bath's.  She understands that despite antibiotics, this could still need drainage in the next few days, given return precautions.     Final diagnoses:  Labial abscess    ED Discharge Orders          Ordered    doxycycline (VIBRAMYCIN) 100 MG capsule  2 times daily        12/23/23 0423    cephALEXin (KEFLEX) 500 MG capsule  4 times daily        12/23/23 0423               Haze Lonni PARAS, MD 12/23/23 [phone]

## 2024-05-29 ENCOUNTER — Encounter: Payer: Self-pay | Admitting: Internal Medicine

## 2024-05-29 ENCOUNTER — Ambulatory Visit: Payer: Self-pay | Admitting: Internal Medicine

## 2024-05-29 VITALS — BP 120/82 | HR 85 | Temp 98.2°F | Resp 16 | Ht 65.0 in | Wt 217.2 lb

## 2024-05-29 DIAGNOSIS — F902 Attention-deficit hyperactivity disorder, combined type: Secondary | ICD-10-CM

## 2024-05-29 DIAGNOSIS — Z0000 Encounter for general adult medical examination without abnormal findings: Secondary | ICD-10-CM

## 2024-05-29 DIAGNOSIS — Z6836 Body mass index (BMI) 36.0-36.9, adult: Secondary | ICD-10-CM | POA: Insufficient documentation

## 2024-05-29 DIAGNOSIS — G43109 Migraine with aura, not intractable, without status migrainosus: Secondary | ICD-10-CM | POA: Insufficient documentation

## 2024-05-29 DIAGNOSIS — J4520 Mild intermittent asthma, uncomplicated: Secondary | ICD-10-CM | POA: Insufficient documentation

## 2024-05-29 MED ORDER — AMPHETAMINE-DEXTROAMPHET ER 30 MG PO CP24: 30.0000 mg | ORAL_CAPSULE | ORAL | 0 refills | Status: AC

## 2024-05-29 MED ORDER — ALBUTEROL SULFATE HFA 108 (90 BASE) MCG/ACT IN AERS: 2.0000 | INHALATION_SPRAY | RESPIRATORY_TRACT | 5 refills | Status: AC | PRN

## 2024-05-29 NOTE — Progress Notes (Signed)
 Office Visit  Subjective   Patient ID: Brittany Gordon   DOB: 12/28/1985   Age: 38 y.o.   MRN: [id number]   Chief Complaint Chief Complaint  Patient presents with   Annual Exam    Physical     History of Present Illness Brittany Gordon is a 38 yo female who comes in today for an annual physical exam.  Her last eye exam was >5 years ago and she denies any problems with her vision.  Her grandfather died from colon cancer but there is no family history of breast cancer, uterine cancer or ovarian cancer.  She has never had a mammogram.  She does see [facility] for her PAP smear.  She does exercise by using a treadmill.  The patient does vape at this time.  She does get yearly flu vaccines.  She has had 2 COVID-19 vaccines in the past.  There is a family history of CAD and stroke in her grandfather.  The patient denies any depression or anxiety.  She is not on an ASA.  The patient is being followed by one of the virtual weight loss companies and she is getting compounded wegovy.  She has been on this since 02/2024 and she has lost 25 lbs.  [name] also has a history of ADHD.  She has not had her medicines in years.  She states that her insurance change last year and she could no longer be on Vyvanse and the NP switched her to Adderall XR 30mg  daily.  She states it's not as good as Vyvanse but it works ok.  She denies any problems with side effects.  She was diagnosed with ADHD in 2010 when she was in college. She was having problems with concentration but no impulsivitiy.  She states her concentration was not that great where she could it down and read a book without being distracted.  She sometimes has racing thoughts and she does proscrastinates alot.  She states if she is not on her medication, she fails to pay close attention to details for assignments and has difficulty following through to completion.  There are also problems with organization skills.  She does fidget mildly.  She does not talk  excessively and does not have difficulty waiting her turn or interrupts people at times.  She also cannot engage in activities in a quiet manner.  Otherwise, he denies any depression or anxiety.    The patient is a 38 year old Caucasian/White female who presents moderately severe asthma which she has had since childhood and presents today for a status visit. She has not had problems with asthma for 15 years and has not had to use any inhalers.  She may use an inhaler twice a month.   She was hospitalized with viral induced asthma flare in her 2nd trimester. She was on prednisone  for most of her pregnancy and she went to see a pulmonologist during that time. See PMH for summary of pulmonary history and lab reports for status of any previous PFTs: Asthma and seasonal allergies. Comorbid conditions : none.. She has no baseline symptoms of asthma. She notices most of her wheezing at bedtime. The patient's condition is controlled by medications as summarized in the medication list on the face sheet. She has the following modifiable risk factors: sedentary lifestyle and obesity. Specifically denied complaints: worsening exertional dyspnea, increased wheezing, productive cough, pleuritic chest pain, purulent sputum, hemoptysis, and fever. She is on Symbicort  since 11/2017 however she is not  taking it everyday. She quit smoking in 09/2016. Interval studies: none.   [name] tells me that she developed post partum depression soon after her delivery of her first child.  This did resolve.  She was on sertraline 25mg  daily but again stopped this years ago.   She also has a history of migraine headaches that started at age 76.  She states she gets auras as well as photophonophobia.  Her migraines will last up to 2 days if she does not treat them.  These are bifrontal headaches associated with nausea but no vomiting.  She will have a migraine once every 6 months.  She will use tylenol  and ibuprofen  as needed and usually this  aborts her migraine.         Past Medical History Past Medical History:  Diagnosis Date   Anxiety    no meds during pregnancy   Asthma    as child   Headache    Hx of chlamydia infection    Hx of varicella    Trichomonas contact, treated      Allergies Allergies  Allergen Reactions   Latex Rash     Medications  Current Outpatient Medications:    acetaminophen  (TYLENOL ) 500 MG tablet, Take 500 mg by mouth every 6 (six) hours as needed., Disp: , Rfl:    albuterol  (PROVENTIL  HFA;VENTOLIN  HFA) 108 (90 Base) MCG/ACT inhaler, Inhale 2 puffs into the lungs every 4 (four) hours as needed for wheezing or shortness of breath. , Disp: , Rfl:    ibuprofen  (ADVIL ,MOTRIN ) 600 MG tablet, Take 1 tablet (600 mg total) by mouth every 6 (six) hours., Disp: 30 tablet, Rfl: 0   Review of Systems Review of Systems  Constitutional:  Negative for chills, fever and malaise/fatigue.  Eyes:  Negative for blurred vision and double vision.  Respiratory:  Negative for cough, hemoptysis, shortness of breath and wheezing.   Cardiovascular:  Negative for chest pain, palpitations and leg swelling.  Gastrointestinal:  Positive for constipation. Negative for abdominal pain, blood in stool, diarrhea, heartburn, melena, nausea and vomiting.  Genitourinary:  Negative for frequency and hematuria.  Musculoskeletal:  Negative for myalgias.  Skin:  Negative for itching and rash.  Neurological:  Negative for dizziness, weakness and headaches.  Endo/Heme/Allergies:  Negative for polydipsia.       Objective:    Vitals BP 120/82   Pulse 85   Temp 98.2 F (36.8 C) (Temporal)   Resp 16   Ht 5' 5 (1.651 m)   Wt 217 lb 3.2 oz (98.5 kg)   SpO2 99%   BMI 36.14 kg/m    Physical Examination Physical Exam Constitutional:      Appearance: Normal appearance. She is not ill-appearing.  HENT:     Head: Normocephalic and atraumatic.     Right Ear: Tympanic membrane, ear canal and external ear normal.      Left Ear: Tympanic membrane, ear canal and external ear normal.     Nose: Nose normal. No congestion or rhinorrhea.     Mouth/Throat:     Mouth: Mucous membranes are moist.     Pharynx: Oropharynx is clear. No oropharyngeal exudate or posterior oropharyngeal erythema.  Eyes:     General: No scleral icterus.    Conjunctiva/sclera: Conjunctivae normal.     Pupils: Pupils are equal, round, and reactive to light.  Neck:     Vascular: No carotid bruit.  Cardiovascular:     Rate and Rhythm: Normal rate and regular rhythm.  Pulses: Normal pulses.     Heart sounds: No murmur heard.    No friction rub. No gallop.  Pulmonary:     Effort: Pulmonary effort is normal. No respiratory distress.     Breath sounds: No wheezing, rhonchi or rales.  Abdominal:     General: Bowel sounds are normal. There is no distension.     Palpations: Abdomen is soft.     Tenderness: There is no abdominal tenderness.  Musculoskeletal:     Cervical back: Neck supple. No tenderness.     Right lower leg: No edema.     Left lower leg: No edema.  Lymphadenopathy:     Cervical: No cervical adenopathy.  Skin:    General: Skin is warm and dry.     Findings: No rash.  Neurological:     General: No focal deficit present.     Mental Status: She is alert and oriented to person, place, and time.  Psychiatric:        Mood and Affect: Mood normal.        Behavior: Behavior normal.        Assessment & Plan:   Migraine with aura and without status migrainosus, not intractable She will continue tylenol  and ibuprofen  as needed.  Mild intermittent asthma without complication We will refill her albuterol  as needed.  Morbid obesity (HCC) I gave her samples of mounjaro 2.5mg  subcut weekly.  I want her to use this instead of some compounded internet wegovy.  She is eating healthy, quit soft drinks and is exercising regularly.  BMI 36.0-36.9,adult Plan as above.  Attention deficit hyperactivity disorder (ADHD),  combined type We will restart her back on her Adderal XR 30mg  daily.  Annual physical exam Health maintenance discussed.  She needs a mammogram when she turns 39.  We will obtain some yearly labs.    Return in about 3 months (around 08/27/2024).   Selinda Fleeta Finger, MD

## 2024-05-29 NOTE — Assessment & Plan Note (Signed)
 Health maintenance discussed.  She needs a mammogram when she turns 39.  We will obtain some yearly labs.

## 2024-05-29 NOTE — Assessment & Plan Note (Signed)
 I gave her samples of mounjaro 2.5mg  subcut weekly.  I want her to use this instead of some compounded internet wegovy.  She is eating healthy, quit soft drinks and is exercising regularly.

## 2024-05-29 NOTE — Assessment & Plan Note (Signed)
 We will refill her albuterol  as needed.

## 2024-05-29 NOTE — Assessment & Plan Note (Signed)
 Plan as above.

## 2024-05-29 NOTE — Assessment & Plan Note (Signed)
 She will continue tylenol  and ibuprofen  as needed.

## 2024-05-29 NOTE — Assessment & Plan Note (Signed)
 We will restart her back on her Adderal XR 30mg  daily.

## 2024-05-30 LAB — CBC WITH DIFFERENTIAL/PLATELET
Basophils Absolute: 0.1 x10E3/uL (ref 0.0–0.2)
Basos: 1 %
EOS (ABSOLUTE): 0.2 x10E3/uL (ref 0.0–0.4)
Eos: 3 %
Hematocrit: 42.1 % (ref 34.0–46.6)
Hemoglobin: 13.9 g/dL (ref 11.1–15.9)
Immature Grans (Abs): 0 x10E3/uL (ref 0.0–0.1)
Immature Granulocytes: 0 %
Lymphocytes Absolute: 1.9 x10E3/uL (ref 0.7–3.1)
Lymphs: 21 %
MCH: 28.8 pg (ref 26.6–33.0)
MCHC: 33 g/dL (ref 31.5–35.7)
MCV: 87 fL (ref 79–97)
Monocytes Absolute: 0.5 x10E3/uL (ref 0.1–0.9)
Monocytes: 5 %
Neutrophils Absolute: 6.5 x10E3/uL (ref 1.4–7.0)
Neutrophils: 70 %
Platelets: 270 x10E3/uL (ref 150–450)
RBC: 4.82 x10E6/uL (ref 3.77–5.28)
RDW: 13.3 % (ref 11.7–15.4)
WBC: 9.3 x10E3/uL (ref 3.4–10.8)

## 2024-05-30 LAB — LIPID PANEL
Chol/HDL Ratio: 4.5 ratio — ABNORMAL HIGH (ref 0.0–4.4)
Cholesterol, Total: 184 mg/dL (ref 100–199)
HDL: 41 mg/dL (ref >39)
LDL Chol Calc (NIH): 128 mg/dL — ABNORMAL HIGH (ref 0–99)
Triglycerides: 81 mg/dL (ref 0–149)
VLDL Cholesterol Cal: 15 mg/dL (ref 5–40)

## 2024-05-30 LAB — CMP14 + ANION GAP
ALT: 24 IU/L (ref 0–32)
AST: 21 IU/L (ref 0–40)
Albumin: 4.4 g/dL (ref 3.9–4.9)
Alkaline Phosphatase: 82 IU/L (ref 41–116)
Anion Gap: 12 mmol/L (ref 10.0–18.0)
BUN/Creatinine Ratio: 18 (ref 9–23)
BUN: 12 mg/dL (ref 6–20)
Bilirubin Total: 0.6 mg/dL (ref 0.0–1.2)
CO2: 25 mmol/L (ref 20–29)
Calcium: 9.3 mg/dL (ref 8.7–10.2)
Chloride: 104 mmol/L (ref 96–106)
Creatinine, Ser: 0.65 mg/dL (ref 0.57–1.00)
Globulin, Total: 2.6 g/dL (ref 1.5–4.5)
Glucose: 90 mg/dL (ref 70–99)
Potassium: 3.9 mmol/L (ref 3.5–5.2)
Sodium: 141 mmol/L (ref 134–144)
Total Protein: 7 g/dL (ref 6.0–8.5)
eGFR: 115 mL/min/1.73 (ref >59)

## 2024-05-30 LAB — HEMOGLOBIN A1C
Est. average glucose Bld gHb Est-mCnc: 100 mg/dL
Hgb A1c MFr Bld: 5.1 % (ref 4.8–5.6)

## 2024-05-30 LAB — TSH: TSH: 1.01 u[IU]/mL (ref 0.450–4.500)

## 2024-06-11 ENCOUNTER — Ambulatory Visit: Payer: Self-pay

## 2024-06-11 NOTE — Progress Notes (Signed)
 Patient called.  Unable to reach patient.  Per Dr. Fleeta Finger Her labs are normal. .  Letter sent.

## 2024-08-27 ENCOUNTER — Ambulatory Visit: Admitting: Internal Medicine
